# Patient Record
Sex: Female | Born: 1960 | Race: White | Hispanic: No | Marital: Married | Smoking: Never smoker
Health system: Southern US, Community
[De-identification: ages and names within clinical notes are randomized; demographics above are authoritative.]

## PROBLEM LIST (undated history)

## (undated) DIAGNOSIS — Z9109 Other allergy status, other than to drugs and biological substances: Secondary | ICD-10-CM

## (undated) DIAGNOSIS — R5382 Chronic fatigue, unspecified: Secondary | ICD-10-CM

## (undated) DIAGNOSIS — N939 Abnormal uterine and vaginal bleeding, unspecified: Secondary | ICD-10-CM

## (undated) DIAGNOSIS — M25519 Pain in unspecified shoulder: Secondary | ICD-10-CM

## (undated) DIAGNOSIS — T7840XA Allergy, unspecified, initial encounter: Secondary | ICD-10-CM

## (undated) DIAGNOSIS — M1990 Unspecified osteoarthritis, unspecified site: Secondary | ICD-10-CM

## (undated) DIAGNOSIS — F419 Anxiety disorder, unspecified: Secondary | ICD-10-CM

## (undated) DIAGNOSIS — IMO0002 Reserved for concepts with insufficient information to code with codable children: Secondary | ICD-10-CM

## (undated) DIAGNOSIS — N951 Menopausal and female climacteric states: Secondary | ICD-10-CM

## (undated) DIAGNOSIS — G4700 Insomnia, unspecified: Secondary | ICD-10-CM

## (undated) HISTORY — PX: OTHER SURGICAL HISTORY: SHX169

## (undated) HISTORY — PX: ENDOMETRIAL ABLATION: SHX621

## (undated) HISTORY — PX: NASAL SINUS SURGERY: SHX719

## (undated) HISTORY — DX: Abnormal uterine and vaginal bleeding, unspecified: N93.9

## (undated) HISTORY — DX: Unspecified osteoarthritis, unspecified site: M19.90

## (undated) HISTORY — DX: Pain in unspecified shoulder: M25.519

## (undated) HISTORY — PX: DILATION AND CURETTAGE OF UTERUS: SHX78

## (undated) HISTORY — DX: Allergy, unspecified, initial encounter: T78.40XA

## (undated) HISTORY — DX: Anxiety disorder, unspecified: F41.9

## (undated) HISTORY — DX: Reserved for concepts with insufficient information to code with codable children: IMO0002

## (undated) HISTORY — DX: Insomnia, unspecified: G47.00

## (undated) HISTORY — PX: TONSILLECTOMY: SUR1361

## (undated) HISTORY — DX: Chronic fatigue, unspecified: R53.82

## (undated) HISTORY — DX: Menopausal and female climacteric states: N95.1

---

## 2006-07-02 ENCOUNTER — Ambulatory Visit: Payer: Self-pay | Admitting: Otolaryngology

## 2006-07-31 ENCOUNTER — Ambulatory Visit: Payer: Self-pay | Admitting: Otolaryngology

## 2006-11-12 ENCOUNTER — Ambulatory Visit: Payer: Self-pay

## 2006-11-21 ENCOUNTER — Ambulatory Visit: Payer: Self-pay

## 2006-11-28 ENCOUNTER — Ambulatory Visit: Payer: Self-pay

## 2009-02-18 ENCOUNTER — Ambulatory Visit: Payer: Self-pay | Admitting: Podiatry

## 2009-02-21 ENCOUNTER — Ambulatory Visit: Payer: Self-pay

## 2010-01-15 HISTORY — PX: AUGMENTATION MAMMAPLASTY: SUR837

## 2010-03-31 ENCOUNTER — Ambulatory Visit: Payer: Self-pay

## 2010-08-28 ENCOUNTER — Ambulatory Visit: Payer: Self-pay | Admitting: Plastic Surgery

## 2011-05-15 ENCOUNTER — Ambulatory Visit: Payer: Self-pay | Admitting: General Practice

## 2014-04-21 ENCOUNTER — Ambulatory Visit
Admit: 2014-04-21 | Disposition: A | Discharge status: Home / Self Care | Payer: Self-pay | Attending: Obstetrics and Gynecology | Admitting: Obstetrics and Gynecology

## 2014-04-30 ENCOUNTER — Ambulatory Visit
Admit: 2014-04-30 | Disposition: A | Discharge status: Home / Self Care | Payer: Self-pay | Attending: Obstetrics and Gynecology | Admitting: Obstetrics and Gynecology

## 2014-08-26 ENCOUNTER — Other Ambulatory Visit: Payer: Self-pay

## 2014-08-26 MED ORDER — NORETHINDRONE-ETH ESTRADIOL 0.5-2.5 MG-MCG PO TABS: 1.0000 | ORAL_TABLET | Freq: Every day | ORAL | Status: DC

## 2014-08-26 NOTE — Progress Notes (Signed)
Pt requested refill of Femhrt until seen in October. Refill sent as requested.

## 2014-10-21 ENCOUNTER — Ambulatory Visit (INDEPENDENT_AMBULATORY_CARE_PROVIDER_SITE_OTHER): Payer: BLUE CROSS/BLUE SHIELD | Admitting: Obstetrics and Gynecology

## 2014-10-21 ENCOUNTER — Encounter: Payer: Self-pay | Admitting: Obstetrics and Gynecology

## 2014-10-21 VITALS — BP 109/73 | HR 81 | Ht 66.0 in | Wt 143.4 lb

## 2014-10-21 DIAGNOSIS — Z780 Asymptomatic menopausal state: Secondary | ICD-10-CM | POA: Insufficient documentation

## 2014-10-21 MED ORDER — NORETHINDRONE-ETH ESTRADIOL 0.5-2.5 MG-MCG PO TABS: 1.0000 | ORAL_TABLET | Freq: Every day | ORAL | Status: DC

## 2014-10-21 NOTE — Progress Notes (Signed)
Patient ID: Kristin Little, female   DOB: [birth year], 54 y.o.   MRN: [id number] 6 month f/u    Anxiety- d/c zoloft- did not like the way it made her feel  using femhrt - sx controlled  Chief complaint: 1.  Anxiety. 2.  Menopause.  ANXIETY: Patient presents for follow-up.  She has discontinued the medication Zoloft recently because of side effects.  She feels that her symptoms are well controlled off medication and does not desire any other intervention at this time. MENOPAUSE: Generic FemHRT is being used with excellent control of vasomotor symptoms.  No side effects are noted.  Patient wishes to continue with medication.  IMPRESSION: 1.Anxiety, controlled off medication. 2.  Menopausal state, well controlled with HRT.  PLAN: 1.  Continue with FEM HRT Daily; Premarin cream biweekly 2.  Return in January 2017 for annual exam.  A total of 15 minutes were spent face-to-face with the patient during this encounter and over half of that time dealt with counseling and coordination of care.

## 2014-11-10 ENCOUNTER — Encounter: Payer: Self-pay | Admitting: Surgery

## 2014-11-10 ENCOUNTER — Ambulatory Visit (INDEPENDENT_AMBULATORY_CARE_PROVIDER_SITE_OTHER): Payer: BLUE CROSS/BLUE SHIELD | Admitting: Surgery

## 2014-11-10 VITALS — BP 130/85 | HR 83 | Temp 98.3°F | Ht 66.0 in | Wt 143.0 lb

## 2014-11-10 DIAGNOSIS — K645 Perianal venous thrombosis: Secondary | ICD-10-CM

## 2014-11-10 NOTE — Progress Notes (Signed)
Subjective:     Patient ID: Kristin Little, female   DOB: November 28, 1960, 54 y.o.   MRN: [id number]  HPI  54 yr old healthy female comes in with 5 days worth of rectal pain and bleeding.  Patient states pain started last Thursday night.  She states she has been trying to get relief with hemorrhoid wipes and keeping area clean.  Patient states had some blood on wiping and one day had some blood and clots in toilet.  She had hemorrhoids after having her daughter but hasn't had an issue with them for about 15 years.    Past Medical History  Diagnosis Date  . Anxiety   . Chronic fatigue   . Abnormal uterine bleeding (AUB)   . Insomnia   . Perimenopausal   . Squamous cell carcinoma [facility])    Past Surgical History  Procedure Laterality Date  . Augmentation mammaplasty    . Tonsillectomy    . Nasal sinus surgery    . Dilation and curettage of uterus    . Endometrial ablation    . Skin cancer removed     Family History  Problem Relation Age of Onset  . Breast cancer Mother   . Hypertension Father   . Hypothyroidism Father   . Endometriosis Sister   . Diabetes Neg Hx   . Ovarian cancer Neg Hx    Social History   Social History  . Marital Status: Married    Spouse Name: N/A  . Number of Children: N/A  . Years of Education: N/A   Social History Main Topics  . Smoking status: Never Smoker   . Smokeless tobacco: None  . Alcohol Use: Yes     Comment: occas  . Drug Use: No  . Sexual Activity: Yes    Birth Control/ Protection: None   Other Topics Concern  . None   Social History Narrative    Current outpatient prescriptions:  .  conjugated estrogens (PREMARIN) vaginal cream, Place 1 Applicatorful vaginally daily., Disp: , Rfl:  .  norethindrone-ethinyl estradiol (FEMHRT LOW DOSE) 0.5-2.5 MG-MCG tablet, Take 1 tablet by mouth daily., Disp: 30 tablet, Rfl: 6 .  phenylephrine-shark liver oil-mineral oil-petrolatum (PREPARATION H) 0.25-3-14-71.9 % rectal ointment, Place 1 application  rectally 2 (two) times daily as needed for hemorrhoids., Disp: , Rfl:  No Known Allergies     Review of Systems  Constitutional: Negative for fever, chills, activity change and appetite change.  Gastrointestinal: Positive for constipation, blood in stool, anal bleeding and rectal pain. Negative for nausea, vomiting, abdominal pain and diarrhea.  Genitourinary: Negative for dysuria, urgency and hematuria.  Skin: Negative for color change and pallor.  Neurological: Negative for dizziness.  Hematological: Negative for adenopathy. Does not bruise/bleed easily.  All other systems reviewed and are negative.  Filed Vitals:   11/10/14 1419  BP: 130/85  Pulse: 83  Temp: 98.3 F (36.8 C)       Objective:   Physical Exam  Constitutional: She is oriented to person, place, and time. She appears well-developed and well-nourished. No distress.  HENT:  Head: Normocephalic and atraumatic.  Mouth/Throat: No oropharyngeal exudate.  Eyes: Conjunctivae and EOM are normal. Pupils are equal, round, and reactive to light. No scleral icterus.  Neck: Normal range of motion. No tracheal deviation present.  Pulmonary/Chest: Effort normal. No respiratory distress.  Abdominal: Soft. She exhibits no distension.  Genitourinary:  Rectal : Large thrombosed hemorrhoid on left side with the superior half appeared as if it ruptured and  inferior half with clot still inside, very thin walled.   Musculoskeletal: Normal range of motion. She exhibits no edema.  Neurological: She is alert and oriented to person, place, and time.  Skin: Skin is warm and dry.  Psychiatric: She has a normal mood and affect. Her behavior is normal. Judgment and thought content normal.       Assessment:     54 yr old with thrombosed external hemorrhoid     Plan:     Explained to patient that since it was 5 days into the thrombosis, that she had been through the worst of the pain at this point.  It appears that superior portion  already expelled clot and inferior should come soon. Offered to lance now but let her know it might not make a difference at this point.  Recommended warm water soaks at least twice daily and after BMs, keeping stool soft with OTC med or what works for her, continuing to keep clean with wipes and lidocaine gel to ease discomfort.  Also instructed to call on Friday AM if wants it lanced.  Patient is agreeable to this plan.

## 2014-11-10 NOTE — Patient Instructions (Addendum)
Warm water bath soaks twice daily Drink plenty of water Keep bowel movements soft  Lidocaine gel and OTC hemorrhoid wipes/creams as needed

## 2014-12-03 ENCOUNTER — Encounter: Payer: Self-pay | Admitting: Physician Assistant

## 2014-12-03 ENCOUNTER — Ambulatory Visit (INDEPENDENT_AMBULATORY_CARE_PROVIDER_SITE_OTHER): Payer: BLUE CROSS/BLUE SHIELD | Admitting: Physician Assistant

## 2014-12-03 VITALS — BP 118/70 | HR 76 | Temp 98.4°F | Resp 16 | Ht 66.0 in | Wt 145.8 lb

## 2014-12-03 DIAGNOSIS — Z0000 Encounter for general adult medical examination without abnormal findings: Secondary | ICD-10-CM

## 2014-12-03 NOTE — Patient Instructions (Signed)

## 2014-12-03 NOTE — Progress Notes (Signed)
Patient: Kristin Little, Female    DOB: 21-Apr-1960, 54 y.o.   MRN: [id number] Visit Date: 12/03/2014  Today's Provider: Margaretann Loveless, PA-C   Chief Complaint  Patient presents with  . Re-establishing care    Used to see Dr. Reuben Likes   Subjective:    Annual physical exam Kristin Little is a 54 y.o. female who presents today for health maintenance and complete physical. She feels well. She reports not exercising. She reports she is sleeping poorly. Got the Influenza vaccine 10/2014.  She has her pap and breast exams with Dr. Greggory Keen. She also has a history of squamous cell carcinoma skin cancer that was removed.  She is followed regularly by Dr. Adolphus Birchwood for annual skin checks.  She also has an enlarged lymph node in her neck that she is followed by Dr. Willeen Cass.  Pap:2015 Normal Mammogram:2016 -----------------------------------------------------------------   Review of Systems  Constitutional: Positive for activity change. Negative for fever and fatigue.  HENT: Positive for congestion and sinus pressure. Negative for nosebleeds, rhinorrhea, sneezing, tinnitus and trouble swallowing.   Eyes: Positive for itching. Negative for visual disturbance.  Respiratory: Negative.   Cardiovascular: Negative.   Gastrointestinal: Negative.   Endocrine: Negative.   Genitourinary: Negative.   Allergic/Immunologic: Positive for environmental allergies.  Neurological: Positive for headaches. Negative for dizziness, weakness and light-headedness.  Psychiatric/Behavioral: Positive for sleep disturbance. Negative for dysphoric mood. The patient is not nervous/anxious.     Social History She  reports that she has never smoked. She does not have any smokeless tobacco history on file. She reports that she drinks alcohol. She reports that she does not use illicit drugs. Social History   Social History  . Marital Status: Married    Spouse Name: N/A  . Number of Children: N/A    . Years of Education: N/A   Social History Main Topics  . Smoking status: Never Smoker   . Smokeless tobacco: None  . Alcohol Use: Yes     Comment: occas  . Drug Use: No  . Sexual Activity: Yes    Birth Control/ Protection: None   Other Topics Concern  . None   Social History Narrative    Patient Active Problem List   Diagnosis Date Noted  . External hemorrhoid, thrombosed 11/10/2014  . Menopause 10/21/2014    Past Surgical History  Procedure Laterality Date  . Augmentation mammaplasty    . Tonsillectomy    . Nasal sinus surgery    . Dilation and curettage of uterus    . Endometrial ablation    . Skin cancer removed      Family History  Family Status  Relation Status Death Age  . Mother Alive   . Father Alive   . Sister Alive   . Brother Alive    Her family history includes Breast cancer in her mother; Endometriosis in her sister; Hypertension in her father; Hypothyroidism in her father; Seizures in her brother. There is no history of Diabetes or Ovarian cancer.    No Known Allergies  Previous Medications   CONJUGATED ESTROGENS (PREMARIN) VAGINAL CREAM    Place 1 Applicatorful vaginally daily.   NORETHINDRONE-ETHINYL ESTRADIOL (FEMHRT LOW DOSE) 0.5-2.5 MG-MCG TABLET    Take 1 tablet by mouth daily.   PHENYLEPHRINE-SHARK LIVER OIL-MINERAL OIL-PETROLATUM (PREPARATION H) 0.25-3-14-71.9 % RECTAL OINTMENT    Place 1 application rectally 2 (two) times daily as needed for hemorrhoids.    Patient Care Team: Margaretann Loveless,  PA-C as PCP - General (Family Medicine)     Objective:   Vitals: BP 118/70 mmHg  Pulse 76  Temp(Src) 98.4 F (36.9 C) (Oral)  Resp 16  Ht 5\' 6"  (1.676 m)  Wt 145 lb 12.8 oz (66.134 kg)  BMI 23.54 kg/m2   Physical Exam  Constitutional: She is oriented to person, place, and time. She appears well-developed and well-nourished. No distress.  HENT:  Head: Normocephalic and atraumatic.  Right Ear: External ear normal.  Left Ear:  External ear normal.  Nose: Nose normal.  Mouth/Throat: Oropharynx is clear and moist. No oropharyngeal exudate.  Eyes: Conjunctivae and EOM are normal. Pupils are equal, round, and reactive to light. Right eye exhibits no discharge. Left eye exhibits no discharge. No scleral icterus.  Neck: Normal range of motion. Neck supple. No JVD present. No tracheal deviation present. No thyromegaly present.  Cardiovascular: Normal rate, regular rhythm, normal heart sounds and intact distal pulses.  Exam reveals no gallop and no friction rub.   No murmur heard. Pulmonary/Chest: Effort normal and breath sounds normal. No respiratory distress. She has no wheezes. She has no rales. She exhibits no tenderness.  Abdominal: Soft. Bowel sounds are normal. She exhibits no distension and no mass. There is no tenderness. There is no rebound and no guarding.  Musculoskeletal: Normal range of motion. She exhibits no edema or tenderness.  Lymphadenopathy:    She has cervical adenopathy (enlarged lymph node noted on left side of neck followed by Dr. Willeen CassBennett).  Neurological: She is alert and oriented to person, place, and time.  Skin: Skin is warm and dry. No rash noted. She is not diaphoretic.  Psychiatric: She has a normal mood and affect. Her behavior is normal. Judgment and thought content normal.  Vitals reviewed.    Depression Screen No flowsheet data found.    Assessment & Plan:     Routine Health Maintenance and Physical Exam  1. Annual physical exam Annual exam fairly normal.  Will check labs as below.  Will f/u pending labs.  If labs are stable will f/u in one year with repeat physical exam. - TSH - CBC with Differential - Comprehensive Metabolic Panel (CMET) - Lipid panel   Exercise Activities and Dietary recommendations Goals    None       There is no immunization history on file for this patient.  Health Maintenance  Topic Date Due  . Hepatitis C Screening  [birth year]  . HIV  Screening  04/19/1977  . TETANUS/TDAP  04/19/1981  . PAP SMEAR  04/20/1983  . MAMMOGRAM  04/19/2012  . COLONOSCOPY  04/19/2012  . INFLUENZA VACCINE  08/16/2014      Discussed health benefits of physical activity, and encouraged her to engage in regular exercise appropriate for her age and condition.    --------------------------------------------------------------------

## 2014-12-17 LAB — CBC WITH DIFFERENTIAL/PLATELET
BASOS: 1 %
Basophils Absolute: 0 10*3/uL (ref 0.0–0.2)
EOS (ABSOLUTE): 0 10*3/uL (ref 0.0–0.4)
EOS: 1 %
HEMATOCRIT: 40.2 % (ref 34.0–46.6)
Hemoglobin: 13.5 g/dL (ref 11.1–15.9)
Immature Grans (Abs): 0 10*3/uL (ref 0.0–0.1)
Immature Granulocytes: 0 %
LYMPHS ABS: 2 10*3/uL (ref 0.7–3.1)
Lymphs: 45 %
MCH: 32.1 pg (ref 26.6–33.0)
MCHC: 33.6 g/dL (ref 31.5–35.7)
MCV: 96 fL (ref 79–97)
MONOCYTES: 6 %
Monocytes Absolute: 0.3 10*3/uL (ref 0.1–0.9)
NEUTROS ABS: 2 10*3/uL (ref 1.4–7.0)
Neutrophils: 47 %
Platelets: 189 10*3/uL (ref 150–379)
RBC: 4.2 x10E6/uL (ref 3.77–5.28)
RDW: 12.4 % (ref 12.3–15.4)
WBC: 4.4 10*3/uL (ref 3.4–10.8)

## 2014-12-17 LAB — COMPREHENSIVE METABOLIC PANEL
ALBUMIN: 4.5 g/dL (ref 3.5–5.5)
ALK PHOS: 76 IU/L (ref 39–117)
ALT: 12 IU/L (ref 0–32)
AST: 18 IU/L (ref 0–40)
Albumin/Globulin Ratio: 1.7 (ref 1.1–2.5)
BUN / CREAT RATIO: 18 (ref 9–23)
BUN: 18 mg/dL (ref 6–24)
Bilirubin Total: 0.4 mg/dL (ref 0.0–1.2)
CO2: 23 mmol/L (ref 18–29)
Calcium: 9 mg/dL (ref 8.7–10.2)
Chloride: 104 mmol/L (ref 97–106)
Creatinine, Ser: 0.98 mg/dL (ref 0.57–1.00)
GFR calc Af Amer: 77 mL/min/{1.73_m2} (ref >59)
GFR, EST NON AFRICAN AMERICAN: 67 mL/min/{1.73_m2} (ref >59)
GLOBULIN, TOTAL: 2.6 g/dL (ref 1.5–4.5)
Glucose: 93 mg/dL (ref 65–99)
Potassium: 4.2 mmol/L (ref 3.5–5.2)
SODIUM: 142 mmol/L (ref 136–144)
Total Protein: 7.1 g/dL (ref 6.0–8.5)

## 2014-12-17 LAB — TSH: TSH: 0.46 u[IU]/mL (ref 0.450–4.500)

## 2014-12-17 LAB — LIPID PANEL
CHOLESTEROL TOTAL: 207 mg/dL — AB (ref 100–199)
Chol/HDL Ratio: 3.7 ratio units (ref 0.0–4.4)
HDL: 56 mg/dL (ref >39)
LDL Calculated: 138 mg/dL — ABNORMAL HIGH (ref 0–99)
Triglycerides: 63 mg/dL (ref 0–149)
VLDL Cholesterol Cal: 13 mg/dL (ref 5–40)

## 2014-12-20 ENCOUNTER — Telehealth: Payer: Self-pay

## 2014-12-20 NOTE — Telephone Encounter (Signed)
-----   Message from Margaretann LovelessJennifer M Burnette, [place]PA-C sent at 12/20/2014  8:38 AM EST ----- All labs are within normal limits and stable with exception of slight elevation in cholesterol.  Total cholesterol is 207 (like below 200) and LDL (bad) cholesterol is 138 (like below 100). HDL (good) cholesterol is elevated at 56, which is good.  Continue with physical activity making sure to try to exercise for 30-40 minutes 3-4 days per week.  Try to limit saturated fats and foods with high cholesterol in diet.  May take fish oil 1200 mg twice daily or red yeast rice as well to assist with more natural ways of lowering cholesterol.  We will recheck in one year.  Thanks! -JB

## 2014-12-20 NOTE — Telephone Encounter (Signed)
Patient advised as directed below. Patient voiced understanding.  Thanks,  -Joseline 

## 2015-01-27 ENCOUNTER — Ambulatory Visit: Payer: BLUE CROSS/BLUE SHIELD | Admitting: Obstetrics and Gynecology

## 2015-02-28 ENCOUNTER — Ambulatory Visit (INDEPENDENT_AMBULATORY_CARE_PROVIDER_SITE_OTHER): Payer: BLUE CROSS/BLUE SHIELD | Admitting: Obstetrics and Gynecology

## 2015-02-28 VITALS — BP 150/74 | HR 80 | Ht 66.0 in | Wt 143.2 lb

## 2015-02-28 DIAGNOSIS — N3091 Cystitis, unspecified with hematuria: Secondary | ICD-10-CM | POA: Insufficient documentation

## 2015-02-28 DIAGNOSIS — R319 Hematuria, unspecified: Secondary | ICD-10-CM

## 2015-02-28 DIAGNOSIS — R300 Dysuria: Secondary | ICD-10-CM

## 2015-02-28 DIAGNOSIS — N390 Urinary tract infection, site not specified: Secondary | ICD-10-CM | POA: Diagnosis not present

## 2015-02-28 DIAGNOSIS — R351 Nocturia: Secondary | ICD-10-CM

## 2015-02-28 LAB — POCT URINALYSIS DIPSTICK
BILIRUBIN UA: NEGATIVE
GLUCOSE UA: NEGATIVE
KETONES UA: NEGATIVE
NITRITE UA: POSITIVE
Protein, UA: 2
Spec Grav, UA: 1.025
Urobilinogen, UA: 0.2
pH, UA: 6

## 2015-02-28 MED ORDER — NITROFURANTOIN MONOHYD MACRO 100 MG PO CAPS: 100.0000 mg | ORAL_CAPSULE | Freq: Two times a day (BID) | ORAL | Status: DC

## 2015-02-28 NOTE — Patient Instructions (Signed)
Asymptomatic Bacteriuria, Female Asymptomatic bacteriuria is the presence of a large number of bacteria in your urine without the usual symptoms of burning or frequent urination. The following conditions increase the risk of asymptomatic bacteriuria:  Diabetes mellitus.  Advanced age.  Pregnancy in the first trimester.  Kidney stones.  Kidney transplants.  Leaky kidney tube valve in young children (reflux). Treatment for this condition is not needed in most people and can lead to other problems such as too much yeast and growth of resistant bacteria. However, some people, such as pregnant women, do need treatment to prevent kidney infection. Asymptomatic bacteriuria in pregnancy is also associated with fetal growth restriction, premature labor, and newborn death. HOME CARE INSTRUCTIONS Monitor your condition for any changes. The following actions may help to relieve any discomfort you are feeling:  Drink enough water and fluids to keep your urine clear or pale yellow. Go to the bathroom more often to keep your bladder empty.  Keep the area around your vagina and rectum clean. Wipe yourself from front to back after urinating. SEEK IMMEDIATE MEDICAL CARE IF:  You develop signs of an infection such as:  Burning with urination.  Frequency of voiding.  Back pain.  Fever.  You have blood in the urine.  You develop a fever. MAKE SURE YOU:  Understand these instructions.  Will watch your condition.  Will get help right away if you are not doing well or get worse.   This information is not intended to replace advice given to you by your health care provider. Make sure you discuss any questions you have with your health care provider.   Document Released: 01/01/2005 Document Revised: 01/22/2014 Document Reviewed: 06/23/2012 Elsevier Interactive Patient Education 2016 Elsevier Inc.  

## 2015-02-28 NOTE — Progress Notes (Signed)
Patient ID: Kristin Little, female   DOB: 26-Sep-1960, 55 y.o.   MRN: [id number]   Pt presents for frequency, burning with urination, pelvic pressure, and nocturia x 2 days. NO fevers noted. NO flank pain. U/a is positive for blood, nitrites, and leuks. Culture sent. Erx Macrobid bid x 7d. Samples of Urogesic blue given. Pt advised to push h20 and cranberry juice. Will contact patient with culture results.   ASSESSMENT: 1. UTI symptoms  PLAN: 1. Macrobid twice a day for 7 days 2. Urology is samples given 3. Increase water intake. 4. Will notify when results are available.  Herold Harms, MD

## 2015-03-03 LAB — URINE CULTURE

## 2015-04-06 ENCOUNTER — Ambulatory Visit (INDEPENDENT_AMBULATORY_CARE_PROVIDER_SITE_OTHER): Payer: BLUE CROSS/BLUE SHIELD | Admitting: Obstetrics and Gynecology

## 2015-04-06 ENCOUNTER — Encounter: Payer: Self-pay | Admitting: Obstetrics and Gynecology

## 2015-04-06 VITALS — BP 130/85 | HR 83 | Temp 98.5°F | Wt 142.1 lb

## 2015-04-06 DIAGNOSIS — N390 Urinary tract infection, site not specified: Secondary | ICD-10-CM

## 2015-04-06 DIAGNOSIS — R319 Hematuria, unspecified: Secondary | ICD-10-CM

## 2015-04-06 LAB — POCT URINALYSIS DIPSTICK
Bilirubin, UA: NEGATIVE
GLUCOSE UA: NEGATIVE
Ketones, UA: NEGATIVE
NITRITE UA: NEGATIVE
Spec Grav, UA: 1.02
UROBILINOGEN UA: NEGATIVE
pH, UA: 6

## 2015-04-06 MED ORDER — NITROFURANTOIN MONOHYD MACRO 100 MG PO CAPS: 100.0000 mg | ORAL_CAPSULE | Freq: Two times a day (BID) | ORAL | Status: DC

## 2015-04-06 NOTE — Patient Instructions (Signed)
1. Increase water intake and cranberry juice intake 2. Macrobid 1 tablet twice a day for 7 days 3. Encourage emptying bladder pre and Post intercourse 4. Return as scheduled for annual exam

## 2015-04-06 NOTE — Addendum Note (Signed)
Addended by: Jackquline DenmarkIDGEWAY, Jemia Fata W on: 04/06/2015 09:29 AM   Modules accepted: Orders

## 2015-04-06 NOTE — Progress Notes (Signed)
Chief complaint: 1. UTI symptoms  Patient presents with several day history of dysuria frequency urgency and voiding blood. No fever; no flank pain; no chills or sweats. Last 2 episodes of UTIs haven't been associated with intercourse.  Past family history, past surgical history, problem list, medications, and allergies are reviewed.  OBJECTIVE: There were no vitals taken for this visit. Well-appearing white female in no acute distress Back: No CVA tenderness Abdomen: Soft, nontender, without organomegaly; no suprapubic tenderness is appreciated Pelvic: Deferred  Urinalysis notable for blood and leukocyte esterase  ASSESSMENT: 1. UTI symptoms and urinalysis consistent with hemorrhagic cystitis  PLAN: 1. Increase by mouth fluid intake and cranberry juice intake 2. Macrobid twice daily for 7 days 3. Return as scheduled for annual exam next month 4. Encourage pre-and post intercourse voiding.   Herold HarmsMartin A Defrancesco, MD  Note: This dictation was prepared with Dragon dictation along with smaller phrase technology. Any transcriptional errors that result from this process are unintentional.

## 2015-04-11 LAB — URINE CULTURE

## 2015-05-04 ENCOUNTER — Ambulatory Visit (INDEPENDENT_AMBULATORY_CARE_PROVIDER_SITE_OTHER): Payer: BLUE CROSS/BLUE SHIELD | Admitting: Obstetrics and Gynecology

## 2015-05-04 ENCOUNTER — Encounter: Payer: Self-pay | Admitting: Obstetrics and Gynecology

## 2015-05-04 VITALS — BP 144/82 | HR 71 | Wt 139.5 lb

## 2015-05-04 DIAGNOSIS — Z0000 Encounter for general adult medical examination without abnormal findings: Secondary | ICD-10-CM

## 2015-05-04 DIAGNOSIS — K648 Other hemorrhoids: Secondary | ICD-10-CM

## 2015-05-04 DIAGNOSIS — Z9889 Other specified postprocedural states: Secondary | ICD-10-CM | POA: Diagnosis not present

## 2015-05-04 DIAGNOSIS — Z1211 Encounter for screening for malignant neoplasm of colon: Secondary | ICD-10-CM | POA: Diagnosis not present

## 2015-05-04 DIAGNOSIS — K644 Residual hemorrhoidal skin tags: Secondary | ICD-10-CM | POA: Insufficient documentation

## 2015-05-04 DIAGNOSIS — Z780 Asymptomatic menopausal state: Secondary | ICD-10-CM | POA: Diagnosis not present

## 2015-05-04 DIAGNOSIS — Z1239 Encounter for other screening for malignant neoplasm of breast: Secondary | ICD-10-CM | POA: Diagnosis not present

## 2015-05-04 DIAGNOSIS — Z01419 Encounter for gynecological examination (general) (routine) without abnormal findings: Secondary | ICD-10-CM

## 2015-05-04 MED ORDER — NORETHINDRONE-ETH ESTRADIOL 0.5-2.5 MG-MCG PO TABS: 1.0000 | ORAL_TABLET | Freq: Every day | ORAL | Status: DC

## 2015-05-04 NOTE — Patient Instructions (Signed)
1. No Pap smear 2. Mammogram ordered 3. Stool guaiac cards given 4. Calcium with vitamin D supplementation encouraged 4. FEMHRT refilled 6. Return in 1 year

## 2015-05-04 NOTE — Progress Notes (Signed)
GYN ANNUAL PREVENTATIVE CARE ENCOUNTER NOTE  Subjective:       Kristin Little is a 55 y.o. No obstetric history on file. female here for a routine annual gynecologic exam.  Current complaints:  1. Recurrent UTI.     Gynecologic History No LMP recorded. Patient is postmenopausal. Contraception: post menopausal status Last Pap: 2015 negative/negative Last mammogram: BI-RADS 1  Menarche-age History of irregular cycles without dysmenorrhea Family history of endometriosis Sr. No history of abnormal Pap smear No History of abnormal mammograms Endometrial ablation 2008  Obstetric History OB History  No data available    Past Medical History  Diagnosis Date  . Anxiety   . Chronic fatigue   . Abnormal uterine bleeding (AUB)   . Insomnia   . Perimenopausal   . Squamous cell carcinoma [facility])     Past Surgical History  Procedure Laterality Date  . Augmentation mammaplasty    . Tonsillectomy    . Nasal sinus surgery    . Dilation and curettage of uterus    . Endometrial ablation    . Skin cancer removed      Current Outpatient Prescriptions on File Prior to Visit  Medication Sig Dispense Refill  . conjugated estrogens (PREMARIN) vaginal cream Place 1 Applicatorful vaginally daily.    . nitrofurantoin, macrocrystal-monohydrate, (MACROBID) 100 MG capsule Take 1 capsule (100 mg total) by mouth 2 (two) times daily. 14 capsule 1  . norethindrone-ethinyl estradiol (FEMHRT LOW DOSE) 0.5-2.5 MG-MCG tablet Take 1 tablet by mouth daily. 30 tablet 6   No current facility-administered medications on file prior to visit.    No Known Allergies  Social History   Social History  . Marital Status: Married    Spouse Name: N/A  . Number of Children: N/A  . Years of Education: N/A   Occupational History  . Not on file.   Social History Main Topics  . Smoking status: Never Smoker   . Smokeless tobacco: Not on file  . Alcohol Use: Yes     Comment: occas  . Drug Use: No  . Sexual  Activity: Yes    Birth Control/ Protection: None   Other Topics Concern  . Not on file   Social History Narrative    Family History  Problem Relation Age of Onset  . Breast cancer Mother   . Hypertension Father   . Hypothyroidism Father   . Endometriosis Sister   . Diabetes Neg Hx   . Ovarian cancer Neg Hx   . Seizures Brother     The following portions of the patient's history were reviewed and updated as appropriate: allergies, current medications, past family history, past medical history, past social history, past surgical history and problem list.  Review of Systems Review of Systems  Constitutional: Negative.   HENT: Negative.   Respiratory: Negative.   Cardiovascular: Negative.   Genitourinary: Negative.   Musculoskeletal: Negative.   Skin: Negative.   Neurological: Negative.   Endo/Heme/Allergies: Negative.   Psychiatric/Behavioral: Negative.      Objective:   There were no vitals taken for this visit. Physical Exam  Constitutional: She is oriented to person, place, and time. She appears well-developed and well-nourished.  HENT:  Head: Normocephalic and atraumatic.  Eyes: Conjunctivae are normal.  Neck: Normal range of motion. Neck supple. No thyromegaly present.  Cardiovascular: Normal rate and regular rhythm.   No murmur heard. Pulmonary/Chest: Effort normal and breath sounds normal.  Abdominal: Soft. She exhibits no distension and no mass. There is  no tenderness. No hernia.  Genitourinary:  External genitalia normal BUS normal Gynecologic normal Cervix-normal Uterus-midplane, normal size and shape, mobile, nontender Adnexa-nonpalpable and nontender Rectovaginal-external nonthrombosed hemorrhoid; normal sphincter tone; no rectal masses  Musculoskeletal: Normal range of motion.  Lymphadenopathy:    She has no cervical adenopathy.  Neurological: She is alert and oriented to person, place, and time.  Skin: Skin is warm and dry.  Psychiatric: She  has a normal mood and affect. Her behavior is normal. Thought content normal.  Vitals reviewed.   Assessment:   Annual gynecologic examination 55 y.o. Contraception: post menopausal status Normal BMI Menopause, asymptomatic on HRT History of recurrent UTI, no recent infection  Plan:  Pap: Next in 2018 and Not needed Mammogram: Ordered Labs: By PCP Routine preventative health maintenance measures emphasized: Diet/Weight control, Tobacco Cessation and Alcohol/Drug use Refill FemHRT Encourage calcium with vitamin D 1200 mg a day Return to Clinic - 1 Year   Herold Harms, MD   Note: This dictation was prepared with Dragon dictation along with smaller phrase technology. Any transcriptional errors that result from this process are unintentional.

## 2015-06-17 ENCOUNTER — Other Ambulatory Visit: Payer: Self-pay

## 2015-06-22 ENCOUNTER — Encounter: Payer: Self-pay | Admitting: *Deleted

## 2015-06-22 DIAGNOSIS — Z85828 Personal history of other malignant neoplasm of skin: Secondary | ICD-10-CM | POA: Diagnosis not present

## 2015-06-30 NOTE — Discharge Instructions (Signed)

## 2015-07-04 ENCOUNTER — Encounter
Admission: RE | Disposition: A | Discharge status: Home / Self Care | Payer: Self-pay | Source: Ambulatory Visit | Attending: Gastroenterology

## 2015-07-04 ENCOUNTER — Ambulatory Visit
Admission: RE | Admit: 2015-07-04 | Discharge: 2015-07-04 | Disposition: A | Discharge status: Home / Self Care | Payer: BLUE CROSS/BLUE SHIELD | Source: Ambulatory Visit | Attending: Gastroenterology | Admitting: Gastroenterology

## 2015-07-04 ENCOUNTER — Ambulatory Visit: Payer: BLUE CROSS/BLUE SHIELD | Admitting: Anesthesiology

## 2015-07-04 DIAGNOSIS — Z803 Family history of malignant neoplasm of breast: Secondary | ICD-10-CM | POA: Insufficient documentation

## 2015-07-04 DIAGNOSIS — Z85828 Personal history of other malignant neoplasm of skin: Secondary | ICD-10-CM | POA: Diagnosis not present

## 2015-07-04 DIAGNOSIS — Z8249 Family history of ischemic heart disease and other diseases of the circulatory system: Secondary | ICD-10-CM | POA: Diagnosis not present

## 2015-07-04 DIAGNOSIS — Z1211 Encounter for screening for malignant neoplasm of colon: Secondary | ICD-10-CM | POA: Diagnosis not present

## 2015-07-04 DIAGNOSIS — Z79899 Other long term (current) drug therapy: Secondary | ICD-10-CM | POA: Insufficient documentation

## 2015-07-04 DIAGNOSIS — G4700 Insomnia, unspecified: Secondary | ICD-10-CM | POA: Insufficient documentation

## 2015-07-04 DIAGNOSIS — Z820 Family history of epilepsy and other diseases of the nervous system: Secondary | ICD-10-CM | POA: Diagnosis not present

## 2015-07-04 DIAGNOSIS — Z842 Family history of other diseases of the genitourinary system: Secondary | ICD-10-CM | POA: Diagnosis not present

## 2015-07-04 DIAGNOSIS — Z8349 Family history of other endocrine, nutritional and metabolic diseases: Secondary | ICD-10-CM | POA: Diagnosis not present

## 2015-07-04 DIAGNOSIS — K641 Second degree hemorrhoids: Secondary | ICD-10-CM | POA: Diagnosis not present

## 2015-07-04 DIAGNOSIS — F419 Anxiety disorder, unspecified: Secondary | ICD-10-CM | POA: Insufficient documentation

## 2015-07-04 HISTORY — PX: COLONOSCOPY WITH PROPOFOL: SHX5780

## 2015-07-04 HISTORY — DX: Other allergy status, other than to drugs and biological substances: Z91.09

## 2015-07-04 SURGERY — COLONOSCOPY WITH PROPOFOL
Anesthesia: Monitor Anesthesia Care | Wound class: Contaminated

## 2015-07-04 MED ORDER — LIDOCAINE HCL (CARDIAC) 20 MG/ML IV SOLN
INTRAVENOUS | Status: DC | PRN
  Administered 2015-07-04: 40 mg via INTRAVENOUS

## 2015-07-04 MED ORDER — PROPOFOL 10 MG/ML IV BOLUS
INTRAVENOUS | Status: DC | PRN
  Administered 2015-07-04 (×2): 40 mg via INTRAVENOUS
  Administered 2015-07-04 (×2): 20 mg via INTRAVENOUS
  Administered 2015-07-04 (×2): 40 mg via INTRAVENOUS

## 2015-07-04 MED ORDER — STERILE WATER FOR IRRIGATION IR SOLN
Status: DC | PRN
  Administered 2015-07-04: 10:00:00

## 2015-07-04 MED ORDER — LACTATED RINGERS IV SOLN
INTRAVENOUS | Status: DC
  Administered 2015-07-04: 09:00:00 via INTRAVENOUS

## 2015-07-04 SURGICAL SUPPLY — 22 items
CANISTER SUCT 1200ML W/VALVE (MISCELLANEOUS) ×3 IMPLANT
CLIP HMST 235XBRD CATH ROT (MISCELLANEOUS) IMPLANT
CLIP RESOLUTION 360 11X235 (MISCELLANEOUS)
FCP ESCP3.2XJMB 240X2.8X (MISCELLANEOUS)
FORCEPS BIOP RAD 4 LRG CAP 4 (CUTTING FORCEPS) IMPLANT
FORCEPS BIOP RJ4 240 W/NDL (MISCELLANEOUS)
FORCEPS ESCP3.2XJMB 240X2.8X (MISCELLANEOUS) IMPLANT
GOWN CVR UNV OPN BCK APRN NK (MISCELLANEOUS) ×2 IMPLANT
GOWN ISOL THUMB LOOP REG UNIV (MISCELLANEOUS) ×4
INJECTOR VARIJECT VIN23 (MISCELLANEOUS) IMPLANT
KIT DEFENDO VALVE AND CONN (KITS) IMPLANT
KIT ENDO PROCEDURE OLY (KITS) ×3 IMPLANT
MARKER SPOT ENDO TATTOO 5ML (MISCELLANEOUS) IMPLANT
PAD GROUND ADULT SPLIT (MISCELLANEOUS) IMPLANT
PROBE APC STR FIRE (PROBE) IMPLANT
SNARE SHORT THROW 13M SML OVAL (MISCELLANEOUS) IMPLANT
SNARE SHORT THROW 30M LRG OVAL (MISCELLANEOUS) IMPLANT
SNARE SNG USE RND 15MM (INSTRUMENTS) IMPLANT
SPOT EX ENDOSCOPIC TATTOO (MISCELLANEOUS)
TRAP ETRAP POLY (MISCELLANEOUS) IMPLANT
VARIJECT INJECTOR VIN23 (MISCELLANEOUS)
WATER STERILE IRR 250ML POUR (IV SOLUTION) ×3 IMPLANT

## 2015-07-04 NOTE — Anesthesia Postprocedure Evaluation (Signed)
Anesthesia Post Note  Patient: Girard CooterConnie M Valenza  Procedure(s) Performed: Procedure(s) (LRB): COLONOSCOPY WITH PROPOFOL (N/A)  Patient location during evaluation: PACU Anesthesia Type: MAC Level of consciousness: awake and alert Pain management: pain level controlled Vital Signs Assessment: post-procedure vital signs reviewed and stable Respiratory status: spontaneous breathing, nonlabored ventilation, respiratory function stable and patient connected to nasal cannula oxygen Cardiovascular status: stable and blood pressure returned to baseline Anesthetic complications: no    Jayveon Convey

## 2015-07-04 NOTE — Op Note (Addendum)
[facility] Patient Name: Kristin Little Procedure Date: 07/04/2015 9:44 AM MRN: [id number] Account #: [id number] Date of Birth: [birth year] Admit Type: Outpatient Age: 55 Room: [facility] OR ROOM 01 Gender: Female Note Status: Finalized Procedure:            Colonoscopy Indications:          Screening for colorectal malignant neoplasm Providers:            Midge Minium, MD Referring MD:         Leo Grosser, MD (Referring MD) Medicines:            Propofol per Anesthesia Complications:        No immediate complications. Procedure:            Pre-Anesthesia Assessment:                       - Prior to the procedure, a History and Physical was                        performed, and patient medications and allergies were                        reviewed. The patient's tolerance of previous                        anesthesia was also reviewed. The risks and benefits of                        the procedure and the sedation options and risks were                        discussed with the patient. All questions were                        answered, and informed consent was obtained. Prior                        Anticoagulants: The patient has taken no previous                        anticoagulant or antiplatelet agents. ASA Grade                        Assessment: II - A patient with mild systemic disease.                        After reviewing the risks and benefits, the patient was                        deemed in satisfactory condition to undergo the                        procedure.                       After obtaining informed consent, the colonoscope was                        passed under direct vision. Throughout the procedure,  the patient's blood pressure, pulse, and oxygen                        saturations were monitored continuously. The Olympus CF                        H180AL colonoscope (S#: [id number]) was introduced through                         the anus and advanced to the the cecum, identified by                        appendiceal orifice and ileocecal valve. The                        colonoscopy was performed without difficulty. The                        patient tolerated the procedure well. The quality of                        the bowel preparation was excellent. Findings:      The perianal and digital rectal examinations were normal.      Non-bleeding internal hemorrhoids were found during retroflexion. The       hemorrhoids were Grade II (internal hemorrhoids that prolapse but reduce       spontaneously). Impression:           - Non-bleeding internal hemorrhoids.                       - No specimens collected. Recommendation:       - Repeat colonoscopy in 10 years for screening unless                        any change in family history or lower GI problems. Procedure Code(s):    --- Professional ---                       [phone], Colonoscopy, flexible; diagnostic, including                        collection of specimen(s) by brushing or washing, when                        performed (separate procedure) Diagnosis Code(s):    --- Professional ---                       Z12.11, Encounter for screening for malignant neoplasm                        of colon                       K64.1, Second degree hemorrhoids CPT copyright 2016 American Medical Association. All rights reserved. The codes documented in this report are preliminary and upon coder review may  be revised to meet current compliance requirements. Midge Minium, MD 07/04/2015 9:59:23 AM This report has been signed electronically. Number of Addenda: 0 Note Initiated On: 07/04/2015 9:44 AM Scope Withdrawal Time: 0 hours 7 minutes 3 seconds  Total  Procedure Duration: 0 hours 13 minutes 38 seconds       [facility]

## 2015-07-04 NOTE — Anesthesia Preprocedure Evaluation (Signed)
Anesthesia Evaluation  Patient identified by MRN, date of birth, ID band  Reviewed: NPO status   History of Anesthesia Complications Negative for: history of anesthetic complications  Airway Mallampati: II  TM Distance: >3 FB Neck ROM: full    Dental no notable dental hx.    Pulmonary neg pulmonary ROS,    Pulmonary exam normal        Cardiovascular Exercise Tolerance: Good negative cardio ROS Normal cardiovascular exam     Neuro/Psych negative neurological ROS  negative psych ROS   GI/Hepatic negative GI ROS, Neg liver ROS,   Endo/Other  negative endocrine ROS  Renal/GU negative Renal ROS  negative genitourinary   Musculoskeletal   Abdominal   Peds  Hematology Skin cancer   Anesthesia Other Findings   Reproductive/Obstetrics                             Anesthesia Physical Anesthesia Plan  ASA: II  Anesthesia Plan: MAC   Post-op Pain Management:    Induction:   Airway Management Planned:   Additional Equipment:   Intra-op Plan:   Post-operative Plan:   Informed Consent: I have reviewed the patients History and Physical, chart, labs and discussed the procedure including the risks, benefits and alternatives for the proposed anesthesia with the patient or authorized representative who has indicated his/her understanding and acceptance.     Plan Discussed with: CRNA  Anesthesia Plan Comments:         Anesthesia Quick Evaluation

## 2015-07-04 NOTE — H&P (Signed)
  Midge Miniumarren Kiano Terrien, MD [facility] [address] [place], [place] [address] Phone: [phone] Fax : [phone]  Primary Care Physician:  Margaretann LovelessJennifer M Burnette, PA-C Primary Gastroenterologist:  Dr. Servando SnareWohl  Pre-Procedure History & Physical: HPI:  Kristin CooterConnie M Little is a 55 y.o. female is here for a screening colonoscopy.   Past Medical History  Diagnosis Date  . Anxiety   . Chronic fatigue   . Abnormal uterine bleeding (AUB)   . Insomnia   . Perimenopausal   . Squamous cell carcinoma (HCC)   . Environmental allergies     Past Surgical History  Procedure Laterality Date  . Augmentation mammaplasty    . Tonsillectomy    . Nasal sinus surgery    . Dilation and curettage of uterus    . Endometrial ablation    . Skin cancer removed      Prior to Admission medications   Medication Sig Start Date End Date Taking? Authorizing Provider  Calcium-Vitamin D-Vitamin K (VIACTIV PO) Take by mouth daily.   Yes Historical Provider, MD  conjugated estrogens (PREMARIN) vaginal cream Place 1 Applicatorful vaginally daily.   Yes Historical Provider, MD  norethindrone-ethinyl estradiol (FEMHRT LOW DOSE) 0.5-2.5 MG-MCG tablet Take 1 tablet by mouth daily. 05/04/15  Yes Herold HarmsMartin A Defrancesco, MD    Allergies as of 06/17/2015  . (No Known Allergies)    Family History  Problem Relation Age of Onset  . Breast cancer Mother   . Hypertension Father   . Hypothyroidism Father   . Endometriosis Sister   . Diabetes Neg Hx   . Ovarian cancer Neg Hx   . Seizures Brother     Social History   Social History  . Marital Status: Married    Spouse Name: N/A  . Number of Children: N/A  . Years of Education: N/A   Occupational History  . Not on file.   Social History Main Topics  . Smoking status: Never Smoker   . Smokeless tobacco: Not on file  . Alcohol Use: Yes     Comment: occas - 4 drinks/mo  . Drug Use: No  . Sexual Activity: Yes    Birth Control/ Protection: None   Other Topics Concern    . Not on file   Social History Narrative    Review of Systems: See HPI, otherwise negative ROS  Physical Exam: Ht 5\' 6"  (1.676 m)  Wt 140 lb (63.504 kg)  BMI 22.61 kg/m2 General:   Alert,  pleasant and cooperative in NAD Head:  Normocephalic and atraumatic. Neck:  Supple; no masses or thyromegaly. Lungs:  Clear throughout to auscultation.    Heart:  Regular rate and rhythm. Abdomen:  Soft, nontender and nondistended. Normal bowel sounds, without guarding, and without rebound.   Neurologic:  Alert and  oriented x4;  grossly normal neurologically.  Impression/Plan: Kristin Little is now here to undergo a screening colonoscopy.  Risks, benefits, and alternatives regarding colonoscopy have been reviewed with the patient.  Questions have been answered.  All parties agreeable.

## 2015-07-04 NOTE — Transfer of Care (Signed)
Immediate Anesthesia Transfer of Care Note  Patient: Kristin CooterConnie M Dornfeld  Procedure(s) Performed: Procedure(s): COLONOSCOPY WITH PROPOFOL (N/A)  Patient Location: PACU  Anesthesia Type: MAC  Level of Consciousness: awake, alert  and patient cooperative  Airway and Oxygen Therapy: Patient Spontanous Breathing and Patient connected to supplemental oxygen  Post-op Assessment: Post-op Vital signs reviewed, Patient's Cardiovascular Status Stable, Respiratory Function Stable, Patent Airway and No signs of Nausea or vomiting  Post-op Vital Signs: Reviewed and stable  Complications: No apparent anesthesia complications

## 2015-07-05 ENCOUNTER — Encounter: Payer: Self-pay | Admitting: Gastroenterology

## 2015-10-25 ENCOUNTER — Other Ambulatory Visit: Payer: Self-pay

## 2015-10-25 MED ORDER — NORETHINDRONE-ETH ESTRADIOL 0.5-2.5 MG-MCG PO TABS: 1.0000 | ORAL_TABLET | Freq: Every day | ORAL | 6 refills | Status: DC

## 2015-12-05 ENCOUNTER — Encounter: Payer: BLUE CROSS/BLUE SHIELD | Admitting: Physician Assistant

## 2015-12-09 ENCOUNTER — Ambulatory Visit (INDEPENDENT_AMBULATORY_CARE_PROVIDER_SITE_OTHER): Payer: BLUE CROSS/BLUE SHIELD | Admitting: Physician Assistant

## 2015-12-09 ENCOUNTER — Other Ambulatory Visit: Payer: Self-pay

## 2015-12-09 ENCOUNTER — Encounter: Payer: Self-pay | Admitting: Physician Assistant

## 2015-12-09 VITALS — BP 120/78 | HR 69 | Temp 98.3°F | Resp 16 | Wt 140.0 lb

## 2015-12-09 DIAGNOSIS — J0100 Acute maxillary sinusitis, unspecified: Secondary | ICD-10-CM

## 2015-12-09 MED ORDER — AMOXICILLIN-POT CLAVULANATE 875-125 MG PO TABS: 1.0000 | ORAL_TABLET | Freq: Two times a day (BID) | ORAL | 0 refills | Status: DC

## 2015-12-09 NOTE — Progress Notes (Unsigned)
       Patient: Kristin Little Female    DOB: 02/26/1960   55 y.o.   MRN: [id number] Visit Date: 12/09/2015  Today's Provider: Marjie SkiffJoseline E Rosas, CMA   No chief complaint on file.  Subjective:    Sinusitis        No Known Allergies   Current Outpatient Prescriptions:  .  Calcium-Vitamin D-Vitamin K (VIACTIV PO), Take by mouth daily., Disp: , Rfl:  .  conjugated estrogens (PREMARIN) vaginal cream, Place 1 Applicatorful vaginally daily., Disp: , Rfl:  .  FLUARIX QUADRIVALENT 0.5 ML injection, , Disp: , Rfl:  .  norethindrone-ethinyl estradiol (FEMHRT LOW DOSE) 0.5-2.5 MG-MCG tablet, Take 1 tablet by mouth daily., Disp: 30 tablet, Rfl: 6  Review of Systems  Social History  Substance Use Topics  . Smoking status: Never Smoker  . Smokeless tobacco: Not on file  . Alcohol use Yes     Comment: occas - 4 drinks/mo   Objective:   There were no vitals taken for this visit.  Physical Exam      Assessment & Plan:           Marjie SkiffJoseline E Rosas, CMA  [facility]

## 2015-12-09 NOTE — Progress Notes (Signed)
Patient: Kristin Little Female    DOB: 06/27/1960   55 y.o.   MRN: [id number] Visit Date: 12/09/2015  Today's Provider: Margaretann LovelessJennifer M Burnette, PA-C   Chief Complaint  Patient presents with  . Sinusitis   Subjective:    Sinusitis  This is a new problem. The current episode started in the past 7 days (Wednesday). The problem has been gradually worsening since onset. There has been no fever. Associated symptoms include chills, congestion, coughing, ear pain, headaches, sinus pressure and a sore throat. Pertinent negatives include no shortness of breath. Treatments tried: Migraine med [employer] brand and Sudafed; has also used saline lavage as well. The treatment provided no relief.      No Known Allergies   Current Outpatient Prescriptions:  .  Calcium-Vitamin D-Vitamin K (VIACTIV PO), Take by mouth daily., Disp: , Rfl:  .  conjugated estrogens (PREMARIN) vaginal cream, Place 1 Applicatorful vaginally daily., Disp: , Rfl:  .  norethindrone-ethinyl estradiol (FEMHRT LOW DOSE) 0.5-2.5 MG-MCG tablet, Take 1 tablet by mouth daily., Disp: 30 tablet, Rfl: 6 .  FLUARIX QUADRIVALENT 0.5 ML injection, , Disp: , Rfl:   Review of Systems  Constitutional: Positive for chills.  HENT: Positive for congestion, ear pain, postnasal drip, rhinorrhea (yellowish color), sinus pain, sinus pressure and sore throat. Negative for trouble swallowing.   Eyes: Negative for visual disturbance.       Eyes hurt  Respiratory: Positive for cough. Negative for chest tightness, shortness of breath and wheezing.   Cardiovascular: Negative for chest pain, palpitations and leg swelling.  Gastrointestinal: Negative for abdominal pain and nausea.  Musculoskeletal:       Body aches  Neurological: Positive for headaches. Negative for dizziness.  Psychiatric/Behavioral: Positive for sleep disturbance.    Social History  Substance Use Topics  . Smoking status: Never Smoker  . Smokeless tobacco: Never Used  .  Alcohol use Yes     Comment: occas - 4 drinks/mo   Objective:   BP 120/78 (BP Location: Right Arm, Patient Position: Sitting, Cuff Size: Normal)   Pulse 69   Temp 98.3 F (36.8 C) (Oral)   Resp 16   Wt 140 lb (63.5 kg)   SpO2 97%   BMI 22.60 kg/m   Physical Exam  Constitutional: She appears well-developed and well-nourished. No distress.  HENT:  Head: Normocephalic and atraumatic.  Right Ear: Hearing, external ear and ear canal normal. Tympanic membrane is not erythematous and not bulging. A middle ear effusion is present.  Left Ear: Hearing, tympanic membrane, external ear and ear canal normal. Tympanic membrane is not erythematous and not bulging.  No middle ear effusion.  Nose: Mucosal edema and rhinorrhea present. Right sinus exhibits maxillary sinus tenderness. Right sinus exhibits no frontal sinus tenderness. Left sinus exhibits maxillary sinus tenderness. Left sinus exhibits no frontal sinus tenderness.  Mouth/Throat: Uvula is midline, oropharynx is clear and moist and mucous membranes are normal. No oropharyngeal exudate, posterior oropharyngeal edema or posterior oropharyngeal erythema.  Neck: Normal range of motion. Neck supple. No tracheal deviation present. No thyromegaly present.  Cardiovascular: Normal rate, regular rhythm and normal heart sounds.  Exam reveals no gallop and no friction rub.   No murmur heard. Pulmonary/Chest: Effort normal and breath sounds normal. No stridor. No respiratory distress. She has no wheezes. She has no rales.  Lymphadenopathy:    She has cervical adenopathy.       Right cervical: Superficial cervical adenopathy present.  Skin: She  is not diaphoretic.  Vitals reviewed.      Assessment & Plan:     1. Acute maxillary sinusitis, recurrence not specified Worsening symptoms that have not responded to OTC medications. Will give augmentin as below. Continue allergy medications and saline nasal washes. Stay well hydrated and get plenty of rest.  Call if no symptom improvement or if symptoms worsen. - amoxicillin-clavulanate (AUGMENTIN) 875-125 MG tablet; Take 1 tablet by mouth 2 (two) times daily.  Dispense: 14 tablet; Refill: 0       Margaretann LovelessJennifer M Burnette, PA-C  [facility]

## 2015-12-09 NOTE — Patient Instructions (Signed)

## 2015-12-27 ENCOUNTER — Ambulatory Visit
Admission: RE | Admit: 2015-12-27 | Discharge: 2015-12-27 | Disposition: A | Discharge status: Home / Self Care | Payer: BLUE CROSS/BLUE SHIELD | Source: Ambulatory Visit | Attending: Obstetrics and Gynecology | Admitting: Obstetrics and Gynecology

## 2015-12-27 ENCOUNTER — Other Ambulatory Visit: Payer: Self-pay | Admitting: Obstetrics and Gynecology

## 2015-12-27 DIAGNOSIS — Z1231 Encounter for screening mammogram for malignant neoplasm of breast: Secondary | ICD-10-CM | POA: Diagnosis not present

## 2015-12-27 DIAGNOSIS — Z1239 Encounter for other screening for malignant neoplasm of breast: Secondary | ICD-10-CM

## 2016-01-30 ENCOUNTER — Ambulatory Visit (INDEPENDENT_AMBULATORY_CARE_PROVIDER_SITE_OTHER): Payer: BLUE CROSS/BLUE SHIELD | Admitting: Family Medicine

## 2016-01-30 ENCOUNTER — Encounter: Payer: Self-pay | Admitting: Family Medicine

## 2016-01-30 VITALS — BP 120/82 | HR 96 | Temp 98.3°F | Resp 16

## 2016-01-30 DIAGNOSIS — J069 Acute upper respiratory infection, unspecified: Secondary | ICD-10-CM

## 2016-01-30 DIAGNOSIS — B9789 Other viral agents as the cause of diseases classified elsewhere: Secondary | ICD-10-CM | POA: Diagnosis not present

## 2016-01-30 MED ORDER — CEFDINIR 300 MG PO CAPS: 300.0000 mg | ORAL_CAPSULE | Freq: Two times a day (BID) | ORAL | 0 refills | Status: DC

## 2016-01-30 NOTE — Progress Notes (Signed)
Subjective:     Patient ID: Girard CooterConnie M Wellbrock, female   DOB: 08/13/1960, 56 y.o.   MRN: [id number]  HPI  Chief Complaint  Patient presents with  . Sinus Problem    Patient comes in office today with complaints of sinus pain and pressure since 01/26/16. Patient reports post nasal drip, sore throat and pain in both ears, patient also complains of yellow nasal drainage. Patient has been taking otc Mucinex, Sudafed and Nettie Pot   States she has had prior sinus surgery. Accompanied by her grand-daughter today   Review of Systems     Objective:   Physical Exam  Constitutional: She appears well-developed and well-nourished. No distress.  Ears: T.M's intact without inflammation Sinuses: mild paranasal sinus tenderness Throat: tonsils absent Neck: right anterior cervical tenderness Lungs: clear     Assessment:    1. Viral upper respiratory tract infection - cefdinir (OMNICEF) 300 MG capsule; Take 1 capsule (300 mg total) by mouth 2 (two) times daily.  Dispense: 20 capsule; Refill: 0    Plan:    Continue with current medication and add topical decongestants x 3 days. Will start abx if sinuses not improving over the next few days.

## 2016-01-30 NOTE — Patient Instructions (Addendum)
Continue Mucinex  and Sudafed (or Mucinex D) along with Neti Pot. Add a nasal decongestant spray like Afrin for 3 days. If sinuses not improving by the end of the the week start the antibiotic.

## 2016-03-07 DIAGNOSIS — M25511 Pain in right shoulder: Secondary | ICD-10-CM | POA: Diagnosis not present

## 2016-03-07 DIAGNOSIS — M7581 Other shoulder lesions, right shoulder: Secondary | ICD-10-CM | POA: Diagnosis not present

## 2016-04-25 ENCOUNTER — Encounter: Payer: BLUE CROSS/BLUE SHIELD | Admitting: Obstetrics and Gynecology

## 2016-05-01 ENCOUNTER — Encounter: Payer: BLUE CROSS/BLUE SHIELD | Admitting: Obstetrics and Gynecology

## 2016-05-14 NOTE — Progress Notes (Signed)
GYN ANNUAL PREVENTATIVE CARE ENCOUNTER NOTE  Subjective:       Kristin Little is a 56 y.o. g1 p1001. female here for a routine annual gynecologic exam.  Current complaints:  1.  Not sleeping well-Taking melatonin 2 tabs at night   Gynecologic History No LMP recorded. Patient is postmenopausal. Contraception: post menopausal status Last Pap: 2015 negative/negative Last mammogram: BI-RADS 1- 12/2015  Menarche-age History of irregular cycles without dysmenorrhea Family history of endometriosis -sister No history of abnormal Pap smear No History of abnormal mammograms Endometrial ablation 2008  Obstetric History OB History  No data available    Past Medical History:  Diagnosis Date  . Abnormal uterine bleeding (AUB)   . Anxiety   . Chronic fatigue   . Environmental allergies   . Insomnia   . Perimenopausal   . Squamous cell carcinoma     Past Surgical History:  Procedure Laterality Date  . AUGMENTATION MAMMAPLASTY  2012  . COLONOSCOPY WITH PROPOFOL N/A 07/04/2015   Procedure: COLONOSCOPY WITH PROPOFOL;  Surgeon: Midge Minium, MD;  Location: [facility];  Service: Endoscopy;  Laterality: N/A;  . DILATION AND CURETTAGE OF UTERUS    . ENDOMETRIAL ABLATION    . NASAL SINUS SURGERY    . skin cancer removed    . TONSILLECTOMY      Current Outpatient Prescriptions on File Prior to Visit  Medication Sig Dispense Refill  . Calcium-Vitamin D-Vitamin K (VIACTIV PO) Take by mouth daily.    . cefdinir (OMNICEF) 300 MG capsule Take 1 capsule (300 mg total) by mouth 2 (two) times daily. 20 capsule 0  . conjugated estrogens (PREMARIN) vaginal cream Place 1 Applicatorful vaginally daily.    . norethindrone-ethinyl estradiol (FEMHRT LOW DOSE) 0.5-2.5 MG-MCG tablet Take 1 tablet by mouth daily. 30 tablet 6   No current facility-administered medications on file prior to visit.     No Known Allergies  Social History   Social History  . Marital status: Married    Spouse  name: N/A  . Number of children: N/A  . Years of education: N/A   Occupational History  . Not on file.   Social History Main Topics  . Smoking status: Never Smoker  . Smokeless tobacco: Never Used  . Alcohol use Yes     Comment: occas - 4 drinks/mo  . Drug use: No  . Sexual activity: Yes    Birth control/ protection: None   Other Topics Concern  . Not on file   Social History Narrative  . No narrative on file    Family History  Problem Relation Age of Onset  . Breast cancer Mother 44  . Hypertension Father   . Hypothyroidism Father   . Endometriosis Sister   . Seizures Brother   . Diabetes Neg Hx   . Ovarian cancer Neg Hx     The following portions of the patient's history were reviewed and updated as appropriate: allergies, current medications, past family history, past medical history, past social history, past surgical history and problem list.  Review of Systems Review of Systems  Constitutional: Negative.  Negative for chills, diaphoresis, fever and malaise/fatigue.  HENT: Positive for congestion.   Respiratory: Negative.  Negative for cough and shortness of breath.   Cardiovascular: Negative.  Negative for chest pain, palpitations, claudication and leg swelling.  Gastrointestinal: Negative for abdominal pain, blood in stool, constipation, diarrhea, nausea and vomiting.  Genitourinary: Negative.  Negative for dysuria, flank pain, frequency, hematuria and urgency.  Musculoskeletal:       Right shoulder pain  Skin: Positive for rash. Negative for itching.  Neurological: Negative.  Negative for dizziness, weakness and headaches.  Endo/Heme/Allergies: Negative.   Psychiatric/Behavioral: The patient has insomnia.      Objective:  BP 123/75   Pulse 89   Ht  (1.676 m)   Wt 138 lb 1.6 oz (62.6 kg)   BMI 22.29 kg/m   Physical Exam  Constitutional: She is oriented to person, place, and time. She appears well-developed and well-nourished.  HENT:  Head:  Normocephalic and atraumatic.  Eyes: Conjunctivae are normal.  Neck: Normal range of motion. Neck supple. No tracheal deviation present. No thyromegaly present.  Non-tender right cervical adenopathy  Cardiovascular: Normal rate, regular rhythm, normal heart sounds and intact distal pulses.   No murmur heard. Pulmonary/Chest: Effort normal and breath sounds normal. She has no wheezes. She has no rales.  Abdominal: Soft. She exhibits no distension and no mass. There is no tenderness. There is no guarding. No hernia.  Genitourinary:  Genitourinary Comments: External genitalia normal BUS normal Gynecologic normal Cervix-normal Uterus-midplane, normal size and shape, mobile, nontender Adnexa-nonpalpable and nontender Rectovaginal-external nonthrombosed hemorrhoid; normal sphincter tone; no rectal masses  Musculoskeletal: Normal range of motion. She exhibits tenderness. She exhibits no edema.  Right shoulder tenderness  Lymphadenopathy:    She has cervical adenopathy.  Neurological: She is alert and oriented to person, place, and time.  Skin: Skin is warm and dry. Capillary refill takes less than 2 seconds. Rash noted.  Rash noted on bilateral lower extremities  Psychiatric: She has a normal mood and affect. Her behavior is normal. Thought content normal.  Vitals reviewed. Breasts: Bilaterally symmetric without dominant masses adenopathy or nipple discharge; saline implants present  Assessment:   Annual gynecologic examination 56 y.o. Contraception: post menopausal status Normal BMI Menopause, asymptomatic on HRT History of recurrent UTI, no recent infection  Plan:  Pap: pap w/hpv Mammogram: Ordered Labs:lipid vit d tsh a1c fbs Stool cards- utd 06/2015 colonoscopy Routine preventative health maintenance measures emphasized: Diet/Weight control, Tobacco Cessation and Alcohol/Drug use Refill FemHRT Encourage calcium with vitamin D 1200 mg a day Return to Clinic - 1 Year   [employer], CMA  Herold Harms, MD   Note: This dictation was prepared with Dragon dictation along with smaller phrase technology. Any transcriptional errors that result from this process are unintentional.

## 2016-05-15 ENCOUNTER — Other Ambulatory Visit: Payer: Self-pay | Admitting: Obstetrics and Gynecology

## 2016-05-15 ENCOUNTER — Ambulatory Visit (INDEPENDENT_AMBULATORY_CARE_PROVIDER_SITE_OTHER): Payer: BLUE CROSS/BLUE SHIELD | Admitting: Obstetrics and Gynecology

## 2016-05-15 ENCOUNTER — Encounter: Payer: Self-pay | Admitting: Obstetrics and Gynecology

## 2016-05-15 VITALS — BP 123/75 | HR 89 | Ht 66.0 in | Wt 138.1 lb

## 2016-05-15 DIAGNOSIS — Z1231 Encounter for screening mammogram for malignant neoplasm of breast: Secondary | ICD-10-CM | POA: Diagnosis not present

## 2016-05-15 DIAGNOSIS — Z9889 Other specified postprocedural states: Secondary | ICD-10-CM

## 2016-05-15 DIAGNOSIS — Z1239 Encounter for other screening for malignant neoplasm of breast: Secondary | ICD-10-CM

## 2016-05-15 DIAGNOSIS — Z780 Asymptomatic menopausal state: Secondary | ICD-10-CM | POA: Diagnosis not present

## 2016-05-15 DIAGNOSIS — Z01419 Encounter for gynecological examination (general) (routine) without abnormal findings: Secondary | ICD-10-CM | POA: Diagnosis not present

## 2016-05-15 DIAGNOSIS — G4700 Insomnia, unspecified: Secondary | ICD-10-CM

## 2016-05-15 MED ORDER — NORETHINDRONE-ETH ESTRADIOL 0.5-2.5 MG-MCG PO TABS: 1.0000 | ORAL_TABLET | Freq: Every day | ORAL | 3 refills | Status: DC

## 2016-05-15 NOTE — Patient Instructions (Signed)
1. Pap smear is done 2. Mammogram due in December 2018 3. Stool guaiac cards deferred due to colonoscopy this year 4. Continue with healthy eating and exercise 5. Continue with calcium and vitamin D supplementation daily 6. FEM hrt is refilled for 1 year 7. Return in 1 year for annual exam  No Health Maintenance for Postmenopausal Women Menopause is a normal process in which your reproductive ability comes to an end. This process happens gradually over a span of months to years, usually between the ages of 32 and 59. Menopause is complete when you have missed 12 consecutive menstrual periods. It is important to talk with your health care provider about some of the most common conditions that affect postmenopausal women, such as heart disease, cancer, and bone loss (osteoporosis). Adopting a healthy lifestyle and getting preventive care can help to promote your health and wellness. Those actions can also lower your chances of developing some of these common conditions. What should I know about menopause? During menopause, you may experience a number of symptoms, such as:  Moderate-to-severe hot flashes.  Night sweats.  Decrease in sex drive.  Mood swings.  Headaches.  Tiredness.  Irritability.  Memory problems.  Insomnia. Choosing to treat or not to treat menopausal changes is an individual decision that you make with your health care provider. What should I know about hormone replacement therapy and supplements? Hormone therapy products are effective for treating symptoms that are associated with menopause, such as hot flashes and night sweats. Hormone replacement carries certain risks, especially as you become older. If you are thinking about using estrogen or estrogen with progestin treatments, discuss the benefits and risks with your health care provider. What should I know about heart disease and stroke? Heart disease, heart attack, and stroke become more likely as you age.  This may be due, in part, to the hormonal changes that your body experiences during menopause. These can affect how your body processes dietary fats, triglycerides, and cholesterol. Heart attack and stroke are both medical emergencies. There are many things that you can do to help prevent heart disease and stroke:  Have your blood pressure checked at least every 1-2 years. High blood pressure causes heart disease and increases the risk of stroke.  If you are 69-65 years old, ask your health care provider if you should take aspirin to prevent a heart attack or a stroke.  Do not use any tobacco products, including cigarettes, chewing tobacco, or electronic cigarettes. If you need help quitting, ask your health care provider.  It is important to eat a healthy diet and maintain a healthy weight.  Be sure to include plenty of vegetables, fruits, low-fat dairy products, and lean protein.  Avoid eating foods that are high in solid fats, added sugars, or salt (sodium).  Get regular exercise. This is one of the most important things that you can do for your health.  Try to exercise for at least 150 minutes each week. The type of exercise that you do should increase your heart rate and make you sweat. This is known as moderate-intensity exercise.  Try to do strengthening exercises at least twice each week. Do these in addition to the moderate-intensity exercise.  Know your numbers.Ask your health care provider to check your cholesterol and your blood glucose. Continue to have your blood tested as directed by your health care provider. What should I know about cancer screening? There are several types of cancer. Take the following steps to reduce your risk  and to catch any cancer development as early as possible. Breast Cancer  Practice breast self-awareness.  This means understanding how your breasts normally appear and feel.  It also means doing regular breast self-exams. Let your health care  provider know about any changes, no matter how small.  If you are 53 or older, have a clinician do a breast exam (clinical breast exam or CBE) every year. Depending on your age, family history, and medical history, it may be recommended that you also have a yearly breast X-ray (mammogram).  If you have a family history of breast cancer, talk with your health care provider about genetic screening.  If you are at high risk for breast cancer, talk with your health care provider about having an MRI and a mammogram every year.  Breast cancer (BRCA) gene test is recommended for women who have family members with BRCA-related cancers. Results of the assessment will determine the need for genetic counseling and BRCA1 and for BRCA2 testing. BRCA-related cancers include these types:  Breast. This occurs in males or females.  Ovarian.  Tubal. This may also be called fallopian tube cancer.  Cancer of the abdominal or pelvic lining (peritoneal cancer).  Prostate.  Pancreatic. Cervical, Uterine, and Ovarian Cancer  Your health care provider may recommend that you be screened regularly for cancer of the pelvic organs. These include your ovaries, uterus, and vagina. This screening involves a pelvic exam, which includes checking for microscopic changes to the surface of your cervix (Pap test).  For women ages 21-65, health care providers may recommend a pelvic exam and a Pap test every three years. For women ages 68-65, they may recommend the Pap test and pelvic exam, combined with testing for human papilloma virus (HPV), every five years. Some types of HPV increase your risk of cervical cancer. Testing for HPV may also be done on women of any age who have unclear Pap test results.  Other health care providers may not recommend any screening for nonpregnant women who are considered low risk for pelvic cancer and have no symptoms. Ask your health care provider if a screening pelvic exam is right for  you.  If you have had past treatment for cervical cancer or a condition that could lead to cancer, you need Pap tests and screening for cancer for at least 20 years after your treatment. If Pap tests have been discontinued for you, your risk factors (such as having a new sexual partner) need to be reassessed to determine if you should start having screenings again. Some women have medical problems that increase the chance of getting cervical cancer. In these cases, your health care provider may recommend that you have screening and Pap tests more often.  If you have a family history of uterine cancer or ovarian cancer, talk with your health care provider about genetic screening.  If you have vaginal bleeding after reaching menopause, tell your health care provider.  There are currently no reliable tests available to screen for ovarian cancer. Lung Cancer  Lung cancer screening is recommended for adults 74-37 years old who are at high risk for lung cancer because of a history of smoking. A yearly low-dose CT scan of the lungs is recommended if you:  Currently smoke.  Have a history of at least 30 pack-years of smoking and you currently smoke or have quit within the past 15 years. A pack-year is smoking an average of one pack of cigarettes per day for one year. Yearly screening should:  Continue until it has been 15 years since you quit.  Stop if you develop a health problem that would prevent you from having lung cancer treatment. Colorectal Cancer  This type of cancer can be detected and can often be prevented.  Routine colorectal cancer screening usually begins at age 49 and continues through age 25.  If you have risk factors for colon cancer, your health care provider may recommend that you be screened at an earlier age.  If you have a family history of colorectal cancer, talk with your health care provider about genetic screening.  Your health care provider may also recommend using  home test kits to check for hidden blood in your stool.  A small camera at the end of a tube can be used to examine your colon directly (sigmoidoscopy or colonoscopy). This is done to check for the earliest forms of colorectal cancer.  Direct examination of the colon should be repeated every 5-10 years until age 86. However, if early forms of precancerous polyps or small growths are found or if you have a family history or genetic risk for colorectal cancer, you may need to be screened more often. Skin Cancer  Check your skin from head to toe regularly.  Monitor any moles. Be sure to tell your health care provider:  About any new moles or changes in moles, especially if there is a change in a mole's shape or color.  If you have a mole that is larger than the size of a pencil eraser.  If any of your family members has a history of skin cancer, especially at a young age, talk with your health care provider about genetic screening.  Always use sunscreen. Apply sunscreen liberally and repeatedly throughout the day.  Whenever you are outside, protect yourself by wearing long sleeves, pants, a wide-brimmed hat, and sunglasses. What should I know about osteoporosis? Osteoporosis is a condition in which bone destruction happens more quickly than new bone creation. After menopause, you may be at an increased risk for osteoporosis. To help prevent osteoporosis or the bone fractures that can happen because of osteoporosis, the following is recommended:  If you are 36-82 years old, get at least 1,000 mg of calcium and at least 600 mg of vitamin D per day.  If you are older than age 18 but younger than age 39, get at least 1,200 mg of calcium and at least 600 mg of vitamin D per day.  If you are older than age 39, get at least 1,200 mg of calcium and at least 800 mg of vitamin D per day. Smoking and excessive alcohol intake increase the risk of osteoporosis. Eat foods that are rich in calcium and  vitamin D, and do weight-bearing exercises several times each week as directed by your health care provider. What should I know about how menopause affects my mental health? Depression may occur at any age, but it is more common as you become older. Common symptoms of depression include:  Low or sad mood.  Changes in sleep patterns.  Changes in appetite or eating patterns.  Feeling an overall lack of motivation or enjoyment of activities that you previously enjoyed.  Frequent crying spells. Talk with your health care provider if you think that you are experiencing depression. What should I know about immunizations? It is important that you get and maintain your immunizations. These include:  Tetanus, diphtheria, and pertussis (Tdap) booster vaccine.  Influenza every year before the flu season begins.  Pneumonia  vaccine.  Shingles vaccine. Your health care provider may also recommend other immunizations. This information is not intended to replace advice given to you by your health care provider. Make sure you discuss any questions you have with your health care provider. Document Released: 02/23/2005 Document Revised: 07/22/2015 Document Reviewed: 10/05/2014 Elsevier Interactive Patient Education  2017 [employer].

## 2016-05-17 LAB — PAP IG AND HPV HIGH-RISK
HPV, HIGH-RISK: NEGATIVE
PAP SMEAR COMMENT: 0

## 2016-05-17 LAB — PLEASE NOTE

## 2016-06-06 ENCOUNTER — Other Ambulatory Visit (INDEPENDENT_AMBULATORY_CARE_PROVIDER_SITE_OTHER): Payer: BLUE CROSS/BLUE SHIELD

## 2016-06-06 ENCOUNTER — Encounter: Payer: BLUE CROSS/BLUE SHIELD | Admitting: Obstetrics and Gynecology

## 2016-06-06 DIAGNOSIS — R319 Hematuria, unspecified: Secondary | ICD-10-CM

## 2016-06-06 DIAGNOSIS — R300 Dysuria: Secondary | ICD-10-CM

## 2016-06-06 LAB — POCT URINALYSIS DIPSTICK
Bilirubin, UA: NEGATIVE
GLUCOSE UA: NEGATIVE
Ketones, UA: NEGATIVE
Nitrite, UA: POSITIVE
Spec Grav, UA: 1.025 (ref 1.010–1.025)
Urobilinogen, UA: 0.2 E.U./dL
pH, UA: 6 (ref 5.0–8.0)

## 2016-06-06 MED ORDER — NITROFURANTOIN MONOHYD MACRO 100 MG PO CAPS: 100.0000 mg | ORAL_CAPSULE | Freq: Two times a day (BID) | ORAL | 0 refills | Status: DC

## 2016-06-08 LAB — URINE CULTURE

## 2016-06-25 DIAGNOSIS — Z85828 Personal history of other malignant neoplasm of skin: Secondary | ICD-10-CM | POA: Diagnosis not present

## 2016-06-25 DIAGNOSIS — L570 Actinic keratosis: Secondary | ICD-10-CM | POA: Diagnosis not present

## 2016-10-19 ENCOUNTER — Other Ambulatory Visit: Payer: Self-pay

## 2016-10-19 MED ORDER — ESTROGENS, CONJUGATED 0.625 MG/GM VA CREA: 0.2500 | TOPICAL_CREAM | VAGINAL | 3 refills | Status: DC

## 2016-10-25 ENCOUNTER — Encounter: Payer: Self-pay | Admitting: Obstetrics and Gynecology

## 2016-10-25 ENCOUNTER — Ambulatory Visit (INDEPENDENT_AMBULATORY_CARE_PROVIDER_SITE_OTHER): Payer: BLUE CROSS/BLUE SHIELD | Admitting: Obstetrics and Gynecology

## 2016-10-25 VITALS — BP 148/79 | HR 82 | Ht 66.0 in | Wt 138.0 lb

## 2016-10-25 DIAGNOSIS — B373 Candidiasis of vulva and vagina: Secondary | ICD-10-CM

## 2016-10-25 DIAGNOSIS — N898 Other specified noninflammatory disorders of vagina: Secondary | ICD-10-CM | POA: Diagnosis not present

## 2016-10-25 DIAGNOSIS — B3731 Acute candidiasis of vulva and vagina: Secondary | ICD-10-CM

## 2016-10-25 MED ORDER — TERCONAZOLE 0.4 % VA CREA: 1.0000 | TOPICAL_CREAM | Freq: Every day | VAGINAL | 0 refills | Status: DC

## 2016-10-25 MED ORDER — NYSTATIN-TRIAMCINOLONE 100000-0.1 UNIT/GM-% EX CREA: 1.0000 "application " | TOPICAL_CREAM | Freq: Two times a day (BID) | CUTANEOUS | 0 refills | Status: DC

## 2016-10-25 NOTE — Progress Notes (Signed)
Chief complaint: 1. Recurrent vulvar irritation  Kristin Little presents today for evaluation of vaginitis. Approximately 1 month ago she self treated with Monistat because of significant irritation in the vulva and vagina region after restarting Premarin cream therapy. She noted that the Premarin cream was out of date. She did have mild symptom relief temporarily. However, symptoms have recurred over the past week with severe external and internal irritation.  She has not been on any new medications recently. She has not had any screening labs for diabetes.  Past medical history, past surgical history, problem list, medications, and allergies are reviewed  OBJECTIVE: BP (!) 148/79   Pulse 82   Ht  (1.676 m)   Wt 138 lb (62.6 kg)   BMI 22.27 kg/m  Pleasant well-appearing female in no acute distress. Alert and oriented. Pelvic exam: External genitalia-labia majora and minora are normal; perianal region is notable for satellite lesions consistent with monilia BUS-normal Vagina-cottage cheese discharge within the vagina Cervix-no lesions Uterus-not examined Bladder-nontender Rectovaginal-perianal rash as noted  ASSESSMENT: 1. Monilia vulvovaginitis  PLAN: 1. Terazol 7 cream intravaginal 7 days 2. Nystatin/triamcinolone cream topically to the vulva and perianal region twice a day for 14 days 3. Hemoglobin A1c 4. Annual exam in May 2019  A total of 15 minutes were spent face-to-face with the patient during this encounter and over half of that time dealt with counseling and coordination of care.  Herold Harms, MD  Note: This dictation was prepared with Dragon dictation along with smaller phrase technology. Any transcriptional errors that result from this process are unintentional.

## 2016-10-25 NOTE — Patient Instructions (Addendum)
1. Terazol 7 cream intravaginal daily 7 days 2. Apply nystatin/triamcinolone cream topically to the vulva twice a day for the next 14 days 3. Hemoglobin A1c is obtained today Vaginal Yeast infection, Adult Vaginal yeast infection is a condition that causes soreness, swelling, and redness (inflammation) of the vagina. It also causes vaginal discharge. This is a common condition. Some women get this infection frequently. What are the causes? This condition is caused by a change in the normal balance of the yeast (candida) and bacteria that live in the vagina. This change causes an overgrowth of yeast, which causes the inflammation. What increases the risk? This condition is more likely to develop in:  Women who take antibiotic medicines.  Women who have diabetes.  Women who take birth control pills.  Women who are pregnant.  Women who douche often.  Women who have a weak defense (immune) system.  Women who have been taking steroid medicines for a long time.  Women who frequently wear tight clothing.  What are the signs or symptoms? Symptoms of this condition include:  White, thick vaginal discharge.  Swelling, itching, redness, and irritation of the vagina. The lips of the vagina (vulva) may be affected as well.  Pain or a burning feeling while urinating.  Pain during sex.  How is this diagnosed? This condition is diagnosed with a medical history and physical exam. This will include a pelvic exam. Your health care provider will examine a sample of your vaginal discharge under a microscope. Your health care provider may send this sample for testing to confirm the diagnosis. How is this treated? This condition is treated with medicine. Medicines may be over-the-counter or prescription. You may be told to use one or more of the following:  Medicine that is taken orally.  Medicine that is applied as a cream.  Medicine that is inserted directly into the vagina  (suppository).  Follow these instructions at home:  Take or apply over-the-counter and prescription medicines only as told by your health care provider.  Do not have sex until your health care provider has approved. Tell your sex partner that you have a yeast infection. That person should go to his or her health care provider if he or she develops symptoms.  Do not wear tight clothes, such as pantyhose or tight pants.  Avoid using tampons until your health care provider approves.  Eat more yogurt. This may help to keep your yeast infection from returning.  Try taking a sitz bath to help with discomfort. This is a warm water bath that is taken while you are sitting down. The water should only come up to your hips and should cover your buttocks. Do this 3-4 times per day or as told by your health care provider.  Do not douche.  Wear breathable, cotton underwear.  If you have diabetes, keep your blood sugar levels under control. Contact a health care provider if:  You have a fever.  Your symptoms go away and then return.  Your symptoms do not get better with treatment.  Your symptoms get worse.  You have new symptoms.  You develop blisters in or around your vagina.  You have blood coming from your vagina and it is not your menstrual period.  You develop pain in your abdomen. This information is not intended to replace advice given to you by your health care provider. Make sure you discuss any questions you have with your health care provider. Document Released: 10/11/2004 Document Revised: 06/15/2015 Document Reviewed:  07/05/2014 Elsevier Interactive Patient Education  [employer].

## 2016-10-26 LAB — HEMOGLOBIN A1C
Est. average glucose Bld gHb Est-mCnc: 103 mg/dL
HEMOGLOBIN A1C: 5.2 % (ref 4.8–5.6)

## 2016-11-11 LAB — CANDIDA 6 SPECIES PROFILE, NAA
CANDIDA ALBICANS, NAA: POSITIVE — AB
CANDIDA PARAPSILOSIS, NAA: NEGATIVE
CANDIDA TROPICALIS, NAA: NEGATIVE
Candida glabrata, NAA: NEGATIVE
Candida krusei, NAA: NEGATIVE
Candida lusitaniae, NAA: NEGATIVE

## 2016-12-12 DIAGNOSIS — M7582 Other shoulder lesions, left shoulder: Secondary | ICD-10-CM | POA: Diagnosis not present

## 2016-12-31 ENCOUNTER — Other Ambulatory Visit: Payer: Self-pay | Admitting: Obstetrics and Gynecology

## 2016-12-31 ENCOUNTER — Ambulatory Visit
Admission: RE | Admit: 2016-12-31 | Discharge: 2016-12-31 | Disposition: A | Discharge status: Home / Self Care | Payer: BLUE CROSS/BLUE SHIELD | Source: Ambulatory Visit | Attending: Obstetrics and Gynecology | Admitting: Obstetrics and Gynecology

## 2016-12-31 DIAGNOSIS — Z1239 Encounter for other screening for malignant neoplasm of breast: Secondary | ICD-10-CM

## 2016-12-31 DIAGNOSIS — Z1231 Encounter for screening mammogram for malignant neoplasm of breast: Secondary | ICD-10-CM | POA: Insufficient documentation

## 2017-01-01 ENCOUNTER — Other Ambulatory Visit: Payer: Self-pay | Admitting: Obstetrics and Gynecology

## 2017-01-01 DIAGNOSIS — N6489 Other specified disorders of breast: Secondary | ICD-10-CM

## 2017-01-01 DIAGNOSIS — R928 Other abnormal and inconclusive findings on diagnostic imaging of breast: Secondary | ICD-10-CM

## 2017-01-10 ENCOUNTER — Ambulatory Visit
Admission: RE | Admit: 2017-01-10 | Discharge: 2017-01-10 | Disposition: A | Discharge status: Home / Self Care | Payer: BLUE CROSS/BLUE SHIELD | Source: Ambulatory Visit | Attending: Obstetrics and Gynecology | Admitting: Obstetrics and Gynecology

## 2017-01-10 DIAGNOSIS — R928 Other abnormal and inconclusive findings on diagnostic imaging of breast: Secondary | ICD-10-CM | POA: Insufficient documentation

## 2017-01-10 DIAGNOSIS — N6489 Other specified disorders of breast: Secondary | ICD-10-CM | POA: Diagnosis not present

## 2017-04-04 ENCOUNTER — Other Ambulatory Visit: Payer: Self-pay | Admitting: Obstetrics and Gynecology

## 2017-04-04 ENCOUNTER — Telehealth: Payer: Self-pay | Admitting: Obstetrics and Gynecology

## 2017-04-04 NOTE — Telephone Encounter (Signed)
Pt calls stating she will need a refill on generic Femhrt in a couple of weeks; went to the pharmacy today to get a refill and was told they couldn't get it - stated this was nationwide. Is there an alternative med for her to take?

## 2017-04-08 ENCOUNTER — Telehealth: Payer: Self-pay | Admitting: Obstetrics and Gynecology

## 2017-04-08 NOTE — Telephone Encounter (Signed)
The patient called and stated that  Her pharmacy stated that there is a nation wide shortage of the medication she needs, and the patient needs to speak with Darol Destinerystal Miller in regards to the medication problem. Please advise.

## 2017-04-08 NOTE — Telephone Encounter (Signed)
Per DC pt has her rx.

## 2017-05-27 NOTE — Progress Notes (Signed)
GYN ANNUAL PREVENTATIVE CARE ENCOUNTER NOTE  Subjective:       Kristin Little is a 57 y.o. g1 p1001. female here for a routine annual gynecologic exam.  Current complaints:   Vaginal dryness- not using premarin as rxed; intercourse is uncomfortable with dryness and dyspareunia, not currently using lubricants.  HRT is being used systemically and desires to be continued.  Bowel function is normal.  Bladder function is normal.  Patient has dermatology appointment for follow-up on multiple moles   Gynecologic History No LMP recorded. Patient is postmenopausal. Contraception: post menopausal status Last Pap: 05/15/2016 negative/negative Last mammogram: [facility] 1- 12/2016  Menarche-age History of irregular cycles without dysmenorrhea Family history of endometriosis -sister No history of abnormal Pap smear No History of abnormal mammograms Endometrial ablation 2008  Obstetric History OB History  Gravida Para Term Preterm AB Living  SAB TAB Ectopic Multiple Live Births          1    # Outcome Date GA Lbr Len/2nd Weight Sex Delivery Anes PTL Lv  1 Term 1986   6 lb 1.6 oz (2.767 kg) F Vag-Spont   LIV    Past Medical History:  Diagnosis Date  . Abnormal uterine bleeding (AUB)   . Anxiety   . Arthritis    shoulder  . Chronic fatigue   . Environmental allergies   . Insomnia   . Perimenopausal   . Squamous cell carcinoma     Past Surgical History:  Procedure Laterality Date  . AUGMENTATION MAMMAPLASTY  2012  . COLONOSCOPY WITH PROPOFOL N/A 07/04/2015   Procedure: COLONOSCOPY WITH PROPOFOL;  Surgeon: Midge Minium, MD;  Location: [facility];  Service: Endoscopy;  Laterality: N/A;  . DILATION AND CURETTAGE OF UTERUS    . ENDOMETRIAL ABLATION    . NASAL SINUS SURGERY    . skin cancer removed    . TONSILLECTOMY      Current Outpatient Medications on File Prior to Visit  Medication Sig Dispense Refill  . Calcium-Vitamin D-Vitamin K (VIACTIV PO) Take by  mouth daily.    Marland Kitchen conjugated estrogens (PREMARIN) vaginal cream Place 0.25 Applicatorfuls vaginally 2 (two) times a week. 42.5 g 3  . JEVANTIQUE LO 0.5-2.5 MG-MCG tablet TAKE 1 TABLET BY MOUTH DAILY 84 tablet 0  . nystatin-triamcinolone (MYCOLOG II) cream Apply 1 application topically 2 (two) times daily. externally 30 g 0  . terconazole (TERAZOL 7) 0.4 % vaginal cream Place 1 applicator vaginally at bedtime. X 7nights 45 g 0   No current facility-administered medications on file prior to visit.     No Known Allergies  Social History   Socioeconomic History  . Marital status: Married    Spouse name: Not on file  . Number of children: Not on file  . Years of education: Not on file  . Highest education level: Not on file  Occupational History  . Not on file  Social Needs  . Financial resource strain: Not on file  . Food insecurity:    Worry: Not on file    Inability: Not on file  . Transportation needs:    Medical: Not on file    Non-medical: Not on file  Tobacco Use  . Smoking status: Never Smoker  . Smokeless tobacco: Never Used  Substance and Sexual Activity  . Alcohol use: Yes    Comment: occas - 4 drinks/mo  . Drug use: No  . Sexual activity: Yes  Birth control/protection: None  Lifestyle  . Physical activity:    Days per week: Not on file    Minutes per session: Not on file  . Stress: Not on file  Relationships  . Social connections:    Talks on phone: Not on file    Gets together: Not on file    Attends religious service: Not on file    Active member of club or organization: Not on file    Attends meetings of clubs or organizations: Not on file    Relationship status: Not on file  . Intimate partner violence:    Fear of current or ex partner: Not on file    Emotionally abused: Not on file    Physically abused: Not on file    Forced sexual activity: Not on file  Other Topics Concern  . Not on file  Social History Narrative  . Not on file    Family  History  Problem Relation Age of Onset  . Breast cancer Mother 44  . Hypertension Father   . Hypothyroidism Father   . Endometriosis Sister   . Seizures Brother   . Diabetes Neg Hx   . Ovarian cancer Neg Hx     The following portions of the patient's history were reviewed and updated as appropriate: allergies, current medications, past family history, past medical history, past social history, past surgical history and problem list.  Review of Systems  Review of Systems  Constitutional:       Vasomotor symptoms-no significant episodes  HENT: Negative.   Eyes: Negative.   Respiratory: Negative.   Cardiovascular: Negative.   Gastrointestinal: Negative.   Genitourinary: Negative.        Dyspareunia Vaginal dryness Not using Premarin cream as directed  Musculoskeletal: Negative.   Skin:       Multiple macules and papules on back; has dermatology visit scheduled next month  Neurological: Negative.   Endo/Heme/Allergies: Negative.   Psychiatric/Behavioral: Negative.     Objective:  BP 120/77   Pulse 80   Ht  (1.676 m)   Wt 131 lb (59.4 kg)   BMI 21.14 kg/m  Physical Exam  BP 120/77   Pulse 80   Ht  (1.676 m)   Wt 131 lb (59.4 kg)   BMI 21.14 kg/m  Pleasant well-appearing female no acute distress.  Alert and oriented. HEENT exam: Normocephalic atraumatic; pupils equal round and reactive; EOMI; no scleral icterus Neck: No adenopathy or thyromegaly Lungs: Clear Heart: Regular rate and rhythm without murmur Breasts: Bilateral symmetric without dominant mass adenopathy or nipple discharge; nipples are everted; breast implants are present. Abdomen: Soft, nontender without organomegaly Pelvic: External genitalia-normal BUS-normal Vagina-mild to moderate atrophy present; no discharge Cervix-no lesions; no cervical motion tenderness Uterus-normal size shape and mobility; midplane; nontender Adnexa-nonpalpable man nontender Rectovaginal-normal external exam;  sphincter tone is normal; no rectal masses Extremities: Warm dry without edema Skin: Multiple macules and papules on back, none suspicious  Assessment:   Annual gynecologic examination 57 y.o. Contraception: post menopausal status Normal BMI Menopause, asymptomatic on HRT History of recurrent UTI, no recent infection  Plan:  Pap: Due 2021 Mammogram: Ordered Labs:lipid vit d tsh a1c fbs Stool cards- ordered Routine preventative health maintenance measures emphasized: Diet/Weight control, Tobacco Cessation and Alcohol/Drug use Refill FemHRT equivalent Encourage calcium with vitamin D 1200 mg a day Return to Clinic - 1 Year Keep dermatology follow-up regarding skin survey Over-the-counter lubricants for vaginal dryness reviewed Patient is encouraged to be consistent  with Premarin cream use for vaginal atrophy   Darol Destine, CMA  Herold Harms, MD  Note: This dictation was prepared with Dragon dictation along with smaller phrase technology. Any transcriptional errors that result from this process are unintentional.

## 2017-05-29 ENCOUNTER — Encounter: Payer: Self-pay | Admitting: Obstetrics and Gynecology

## 2017-05-29 ENCOUNTER — Ambulatory Visit (INDEPENDENT_AMBULATORY_CARE_PROVIDER_SITE_OTHER): Payer: BLUE CROSS/BLUE SHIELD | Admitting: Obstetrics and Gynecology

## 2017-05-29 VITALS — BP 120/77 | HR 80 | Ht 66.0 in | Wt 131.0 lb

## 2017-05-29 DIAGNOSIS — Z780 Asymptomatic menopausal state: Secondary | ICD-10-CM

## 2017-05-29 DIAGNOSIS — N941 Unspecified dyspareunia: Secondary | ICD-10-CM | POA: Diagnosis not present

## 2017-05-29 DIAGNOSIS — N952 Postmenopausal atrophic vaginitis: Secondary | ICD-10-CM

## 2017-05-29 DIAGNOSIS — Z1231 Encounter for screening mammogram for malignant neoplasm of breast: Secondary | ICD-10-CM

## 2017-05-29 DIAGNOSIS — Z9889 Other specified postprocedural states: Secondary | ICD-10-CM

## 2017-05-29 DIAGNOSIS — Z1239 Encounter for other screening for malignant neoplasm of breast: Secondary | ICD-10-CM

## 2017-05-29 DIAGNOSIS — Z01419 Encounter for gynecological examination (general) (routine) without abnormal findings: Secondary | ICD-10-CM | POA: Diagnosis not present

## 2017-05-29 DIAGNOSIS — Z1211 Encounter for screening for malignant neoplasm of colon: Secondary | ICD-10-CM | POA: Diagnosis not present

## 2017-05-29 MED ORDER — NORETHINDRONE-ETH ESTRADIOL 0.5-2.5 MG-MCG PO TABS: 1.0000 | ORAL_TABLET | Freq: Every day | ORAL | 3 refills | Status: DC

## 2017-05-29 NOTE — Patient Instructions (Signed)
1.  No Pap smear is done 2.  Mammogram is ordered 3.  Stool guaiac cards for colon cancer screening normal given. 4.  Screening labs are ordered 5.  Hormone replacement therapy is refilled for 1 year 6.  Over-the-counter lubricants include:  Virgin olive oil  Astroglide  Jo H2O lubricant 7.  Return in 1 year for annual exam   Health Maintenance for Postmenopausal Women Menopause is a normal process in which your reproductive ability comes to an end. This process happens gradually over a span of months to years, usually between the ages of 73 and 54. Menopause is complete when you have missed 12 consecutive menstrual periods. It is important to talk with your health care provider about some of the most common conditions that affect postmenopausal women, such as heart disease, cancer, and bone loss (osteoporosis). Adopting a healthy lifestyle and getting preventive care can help to promote your health and wellness. Those actions can also lower your chances of developing some of these common conditions. What should I know about menopause? During menopause, you may experience a number of symptoms, such as:  Moderate-to-severe hot flashes.  Night sweats.  Decrease in sex drive.  Mood swings.  Headaches.  Tiredness.  Irritability.  Memory problems.  Insomnia.  Choosing to treat or not to treat menopausal changes is an individual decision that you make with your health care provider. What should I know about hormone replacement therapy and supplements? Hormone therapy products are effective for treating symptoms that are associated with menopause, such as hot flashes and night sweats. Hormone replacement carries certain risks, especially as you become older. If you are thinking about using estrogen or estrogen with progestin treatments, discuss the benefits and risks with your health care provider. What should I know about heart disease and stroke? Heart disease, heart attack, and  stroke become more likely as you age. This may be due, in part, to the hormonal changes that your body experiences during menopause. These can affect how your body processes dietary fats, triglycerides, and cholesterol. Heart attack and stroke are both medical emergencies. There are many things that you can do to help prevent heart disease and stroke:  Have your blood pressure checked at least every 1-2 years. High blood pressure causes heart disease and increases the risk of stroke.  If you are 61-24 years old, ask your health care provider if you should take aspirin to prevent a heart attack or a stroke.  Do not use any tobacco products, including cigarettes, chewing tobacco, or electronic cigarettes. If you need help quitting, ask your health care provider.  It is important to eat a healthy diet and maintain a healthy weight. ? Be sure to include plenty of vegetables, fruits, low-fat dairy products, and lean protein. ? Avoid eating foods that are high in solid fats, added sugars, or salt (sodium).  Get regular exercise. This is one of the most important things that you can do for your health. ? Try to exercise for at least 150 minutes each week. The type of exercise that you do should increase your heart rate and make you sweat. This is known as moderate-intensity exercise. ? Try to do strengthening exercises at least twice each week. Do these in addition to the moderate-intensity exercise.  Know your numbers.Ask your health care provider to check your cholesterol and your blood glucose. Continue to have your blood tested as directed by your health care provider.  What should I know about cancer screening? There are  several types of cancer. Take the following steps to reduce your risk and to catch any cancer development as early as possible. Breast Cancer  Practice breast self-awareness. ? This means understanding how your breasts normally appear and feel. ? It also means doing regular  breast self-exams. Let your health care provider know about any changes, no matter how small.  If you are 49 or older, have a clinician do a breast exam (clinical breast exam or CBE) every year. Depending on your age, family history, and medical history, it may be recommended that you also have a yearly breast X-ray (mammogram).  If you have a family history of breast cancer, talk with your health care provider about genetic screening.  If you are at high risk for breast cancer, talk with your health care provider about having an MRI and a mammogram every year.  Breast cancer (BRCA) gene test is recommended for women who have family members with BRCA-related cancers. Results of the assessment will determine the need for genetic counseling and BRCA1 and for BRCA2 testing. BRCA-related cancers include these types: ? Breast. This occurs in males or females. ? Ovarian. ? Tubal. This may also be called fallopian tube cancer. ? Cancer of the abdominal or pelvic lining (peritoneal cancer). ? Prostate. ? Pancreatic.  Cervical, Uterine, and Ovarian Cancer Your health care provider may recommend that you be screened regularly for cancer of the pelvic organs. These include your ovaries, uterus, and vagina. This screening involves a pelvic exam, which includes checking for microscopic changes to the surface of your cervix (Pap test).  For women ages 21-65, health care providers may recommend a pelvic exam and a Pap test every three years. For women ages 28-65, they may recommend the Pap test and pelvic exam, combined with testing for human papilloma virus (HPV), every five years. Some types of HPV increase your risk of cervical cancer. Testing for HPV may also be done on women of any age who have unclear Pap test results.  Other health care providers may not recommend any screening for nonpregnant women who are considered low risk for pelvic cancer and have no symptoms. Ask your health care provider if a  screening pelvic exam is right for you.  If you have had past treatment for cervical cancer or a condition that could lead to cancer, you need Pap tests and screening for cancer for at least 20 years after your treatment. If Pap tests have been discontinued for you, your risk factors (such as having a new sexual partner) need to be reassessed to determine if you should start having screenings again. Some women have medical problems that increase the chance of getting cervical cancer. In these cases, your health care provider may recommend that you have screening and Pap tests more often.  If you have a family history of uterine cancer or ovarian cancer, talk with your health care provider about genetic screening.  If you have vaginal bleeding after reaching menopause, tell your health care provider.  There are currently no reliable tests available to screen for ovarian cancer.  Lung Cancer Lung cancer screening is recommended for adults 53-31 years old who are at high risk for lung cancer because of a history of smoking. A yearly low-dose CT scan of the lungs is recommended if you:  Currently smoke.  Have a history of at least 30 pack-years of smoking and you currently smoke or have quit within the past 15 years. A pack-year is smoking an average of  one pack of cigarettes per day for one year.  Yearly screening should:  Continue until it has been 15 years since you quit.  Stop if you develop a health problem that would prevent you from having lung cancer treatment.  Colorectal Cancer  This type of cancer can be detected and can often be prevented.  Routine colorectal cancer screening usually begins at age 42 and continues through age 65.  If you have risk factors for colon cancer, your health care provider may recommend that you be screened at an earlier age.  If you have a family history of colorectal cancer, talk with your health care provider about genetic screening.  Your health  care provider may also recommend using home test kits to check for hidden blood in your stool.  A small camera at the end of a tube can be used to examine your colon directly (sigmoidoscopy or colonoscopy). This is done to check for the earliest forms of colorectal cancer.  Direct examination of the colon should be repeated every 5-10 years until age 40. However, if early forms of precancerous polyps or small growths are found or if you have a family history or genetic risk for colorectal cancer, you may need to be screened more often.  Skin Cancer  Check your skin from head to toe regularly.  Monitor any moles. Be sure to tell your health care provider: ? About any new moles or changes in moles, especially if there is a change in a mole's shape or color. ? If you have a mole that is larger than the size of a pencil eraser.  If any of your family members has a history of skin cancer, especially at a young age, talk with your health care provider about genetic screening.  Always use sunscreen. Apply sunscreen liberally and repeatedly throughout the day.  Whenever you are outside, protect yourself by wearing long sleeves, pants, a wide-brimmed hat, and sunglasses.  What should I know about osteoporosis? Osteoporosis is a condition in which bone destruction happens more quickly than new bone creation. After menopause, you may be at an increased risk for osteoporosis. To help prevent osteoporosis or the bone fractures that can happen because of osteoporosis, the following is recommended:  If you are 53-66 years old, get at least 1,000 mg of calcium and at least 600 mg of vitamin D per day.  If you are older than age 33 but younger than age 55, get at least 1,200 mg of calcium and at least 600 mg of vitamin D per day.  If you are older than age 5, get at least 1,200 mg of calcium and at least 800 mg of vitamin D per day.  Smoking and excessive alcohol intake increase the risk of  osteoporosis. Eat foods that are rich in calcium and vitamin D, and do weight-bearing exercises several times each week as directed by your health care provider. What should I know about how menopause affects my mental health? Depression may occur at any age, but it is more common as you become older. Common symptoms of depression include:  Low or sad mood.  Changes in sleep patterns.  Changes in appetite or eating patterns.  Feeling an overall lack of motivation or enjoyment of activities that you previously enjoyed.  Frequent crying spells.  Talk with your health care provider if you think that you are experiencing depression. What should I know about immunizations? It is important that you get and maintain your immunizations. These include:  Tetanus, diphtheria, and pertussis (Tdap) booster vaccine.  Influenza every year before the flu season begins.  Pneumonia vaccine.  Shingles vaccine.  Your health care provider may also recommend other immunizations. This information is not intended to replace advice given to you by your health care provider. Make sure you discuss any questions you have with your health care provider. Document Released: 02/23/2005 Document Revised: 07/22/2015 Document Reviewed: 10/05/2014 Elsevier Interactive Patient Education  2018 [employer]  

## 2017-05-31 ENCOUNTER — Telehealth: Payer: Self-pay | Admitting: *Deleted

## 2017-05-31 NOTE — Telephone Encounter (Signed)
Patient called and states that she seen Dr. Tommi Rumps on the 15th and the nurse was suppose to check on a medication for her and call her back. The patient is requesting a cal back. Her contact number is [phone]. Please advise. Thank you

## 2017-05-31 NOTE — Telephone Encounter (Signed)
Pt aware per pharmacist jevantique lo is generic femhrt.

## 2017-06-18 DIAGNOSIS — J0140 Acute pansinusitis, unspecified: Secondary | ICD-10-CM | POA: Diagnosis not present

## 2017-06-24 DIAGNOSIS — D2261 Melanocytic nevi of right upper limb, including shoulder: Secondary | ICD-10-CM | POA: Diagnosis not present

## 2017-06-24 DIAGNOSIS — Z85828 Personal history of other malignant neoplasm of skin: Secondary | ICD-10-CM | POA: Diagnosis not present

## 2017-06-24 DIAGNOSIS — D2262 Melanocytic nevi of left upper limb, including shoulder: Secondary | ICD-10-CM | POA: Diagnosis not present

## 2017-06-24 DIAGNOSIS — D2272 Melanocytic nevi of left lower limb, including hip: Secondary | ICD-10-CM | POA: Diagnosis not present

## 2017-06-26 ENCOUNTER — Other Ambulatory Visit: Payer: Self-pay | Admitting: Obstetrics and Gynecology

## 2017-09-11 ENCOUNTER — Ambulatory Visit: Payer: BLUE CROSS/BLUE SHIELD | Admitting: Obstetrics and Gynecology

## 2017-09-11 ENCOUNTER — Encounter: Payer: Self-pay | Admitting: Obstetrics and Gynecology

## 2017-09-11 VITALS — BP 118/76 | HR 76 | Temp 98.7°F | Ht 66.0 in | Wt 129.6 lb

## 2017-09-11 DIAGNOSIS — R300 Dysuria: Secondary | ICD-10-CM | POA: Diagnosis not present

## 2017-09-11 DIAGNOSIS — N3091 Cystitis, unspecified with hematuria: Secondary | ICD-10-CM

## 2017-09-11 DIAGNOSIS — R350 Frequency of micturition: Secondary | ICD-10-CM

## 2017-09-11 LAB — POCT URINALYSIS DIPSTICK
Bilirubin, UA: NEGATIVE
Glucose, UA: NEGATIVE
KETONES UA: NEGATIVE
NITRITE UA: POSITIVE
Odor: NEGATIVE
PH UA: 6 (ref 5.0–8.0)
PROTEIN UA: POSITIVE — AB
SPEC GRAV UA: 1.02 (ref 1.010–1.025)
UROBILINOGEN UA: 0.2 U/dL

## 2017-09-11 MED ORDER — NITROFURANTOIN MONOHYD MACRO 100 MG PO CAPS: 100.0000 mg | ORAL_CAPSULE | Freq: Two times a day (BID) | ORAL | 0 refills | Status: DC

## 2017-09-11 NOTE — Patient Instructions (Signed)
1.  Increase water intake 2.  Increase cranberry juice intake or cranberry tabs 3.  Take Macrobid twice daily for 7 days 4.  Urine culture is sent to confirm UTI sensitivity   Urinary Tract Infection, Adult A urinary tract infection (UTI) is an infection of any part of the urinary tract, which includes the kidneys, ureters, bladder, and urethra. These organs make, store, and get rid of urine in the body. UTI can be a bladder infection (cystitis) or kidney infection (pyelonephritis). What are the causes? This infection may be caused by fungi, viruses, or bacteria. Bacteria are the most common cause of UTIs. This condition can also be caused by repeated incomplete emptying of the bladder during urination. What increases the risk? This condition is more likely to develop if:  You ignore your need to urinate or hold urine for long periods of time.  You do not empty your bladder completely during urination.  You wipe back to front after urinating or having a bowel movement, if you are female.  You are uncircumcised, if you are female.  You are constipated.  You have a urinary catheter that stays in place (indwelling).  You have a weak defense (immune) system.  You have a medical condition that affects your bowels, kidneys, or bladder.  You have diabetes.  You take antibiotic medicines frequently or for long periods of time, and the antibiotics no longer work well against certain types of infections (antibiotic resistance).  You take medicines that irritate your urinary tract.  You are exposed to chemicals that irritate your urinary tract.  You are female.  What are the signs or symptoms? Symptoms of this condition include:  Fever.  Frequent urination or passing small amounts of urine frequently.  Needing to urinate urgently.  Pain or burning with urination.  Urine that smells bad or unusual.  Cloudy urine.  Pain in the lower abdomen or back.  Trouble  urinating.  Blood in the urine.  Vomiting or being less hungry than normal.  Diarrhea or abdominal pain.  Vaginal discharge, if you are female.  How is this diagnosed? This condition is diagnosed with a medical history and physical exam. You will also need to provide a urine sample to test your urine. Other tests may be done, including:  Blood tests.  Sexually transmitted disease (STD) testing.  If you have had more than one UTI, a cystoscopy or imaging studies may be done to determine the cause of the infections. How is this treated? Treatment for this condition often includes a combination of two or more of the following:  Antibiotic medicine.  Other medicines to treat less common causes of UTI.  Over-the-counter medicines to treat pain.  Drinking enough water to stay hydrated.  Follow these instructions at home:  Take over-the-counter and prescription medicines only as told by your health care provider.  If you were prescribed an antibiotic, take it as told by your health care provider. Do not stop taking the antibiotic even if you start to feel better.  Avoid alcohol, caffeine, tea, and carbonated beverages. They can irritate your bladder.  Drink enough fluid to keep your urine clear or pale yellow.  Keep all follow-up visits as told by your health care provider. This is important.  Make sure to: ? Empty your bladder often and completely. Do not hold urine for long periods of time. ? Empty your bladder before and after sex. ? Wipe from front to back after a bowel movement if you are female.  Use each tissue one time when you wipe. Contact a health care provider if:  You have back pain.  You have a fever.  You feel nauseous or vomit.  Your symptoms do not get better after 3 days.  Your symptoms go away and then return. Get help right away if:  You have severe back pain or lower abdominal pain.  You are vomiting and cannot keep down any medicines or  water. This information is not intended to replace advice given to you by your health care provider. Make sure you discuss any questions you have with your health care provider. Document Released: 10/11/2004 Document Revised: 06/15/2015 Document Reviewed: 11/22/2014 Elsevier Interactive Patient Education  [employer].

## 2017-09-11 NOTE — Progress Notes (Signed)
Chief complaint: 1.  Urinary frequency 2.  Urinary urgency 3.  Blood in urine  Patient presents today for evaluation of possible UTI.  She has developed urinary frequency and urgency and today also noted blood in her urine.  She denies fevers chills or sweats.  She denies flank pain.  She denies bladder pain. Increase water intake has not helped her symptoms.  Past medical history, past surgical history, problem list, medications, and allergies are reviewed  Review of systems: Per HPI  OBJECTIVE: BP 118/76   Pulse 76   Temp 98.7 F (37.1 C)   Ht 5\' 6"  (1.676 m)   Wt 129 lb 9.6 oz (58.8 kg)   BMI 20.92 kg/m  Well-appearing female no acute distress.  Alert and oriented. Back: No CVA tenderness Abdomen: Soft, nontender Bladder: Nontender Pelvic: Not examined Extremities: Warm and dry   Urinalysis: Large blood; positive protein; positive nitrites; 2+ leukocytes  ASSESSMENT: 1.  Hemorrhagic cystitis  PLAN: 1.  Increase water intake 2.  Increase cranberry juice or cranberry tablet intake 3.  Macrobid twice daily for 7 days 4.  Urine culture to confirm sensitivity of UTI bacteria 5.  Return as needed.  Herold HarmsMartin A Markeisha Mancias, MD  Note: This dictation was prepared with Dragon dictation along with smaller phrase technology. Any transcriptional errors that result from this process are unintentional.

## 2017-09-13 LAB — URINE CULTURE

## 2017-09-14 ENCOUNTER — Other Ambulatory Visit: Payer: Self-pay | Admitting: Obstetrics and Gynecology

## 2017-12-11 ENCOUNTER — Other Ambulatory Visit: Payer: Self-pay | Admitting: Obstetrics and Gynecology

## 2018-01-17 DIAGNOSIS — M5412 Radiculopathy, cervical region: Secondary | ICD-10-CM | POA: Diagnosis not present

## 2018-01-17 DIAGNOSIS — M4722 Other spondylosis with radiculopathy, cervical region: Secondary | ICD-10-CM | POA: Diagnosis not present

## 2018-02-10 DIAGNOSIS — M6281 Muscle weakness (generalized): Secondary | ICD-10-CM | POA: Diagnosis not present

## 2018-02-19 DIAGNOSIS — M6281 Muscle weakness (generalized): Secondary | ICD-10-CM | POA: Diagnosis not present

## 2018-02-25 ENCOUNTER — Ambulatory Visit: Payer: Self-pay | Admitting: Podiatry

## 2018-02-26 DIAGNOSIS — M6281 Muscle weakness (generalized): Secondary | ICD-10-CM | POA: Diagnosis not present

## 2018-03-05 DIAGNOSIS — M6281 Muscle weakness (generalized): Secondary | ICD-10-CM | POA: Diagnosis not present

## 2018-03-12 DIAGNOSIS — M6281 Muscle weakness (generalized): Secondary | ICD-10-CM | POA: Diagnosis not present

## 2018-03-19 DIAGNOSIS — M6281 Muscle weakness (generalized): Secondary | ICD-10-CM | POA: Diagnosis not present

## 2018-03-26 DIAGNOSIS — M6281 Muscle weakness (generalized): Secondary | ICD-10-CM | POA: Diagnosis not present

## 2018-05-13 ENCOUNTER — Other Ambulatory Visit: Payer: Self-pay | Admitting: *Deleted

## 2018-05-13 MED ORDER — NORETHINDRONE-ETH ESTRADIOL 0.5-2.5 MG-MCG PO TABS: 1.0000 | ORAL_TABLET | Freq: Every day | ORAL | 3 refills | Status: DC

## 2018-06-03 ENCOUNTER — Encounter: Payer: BLUE CROSS/BLUE SHIELD | Admitting: Obstetrics and Gynecology

## 2018-06-06 ENCOUNTER — Encounter: Payer: BLUE CROSS/BLUE SHIELD | Admitting: Obstetrics and Gynecology

## 2018-08-19 ENCOUNTER — Encounter: Payer: Self-pay | Admitting: Obstetrics and Gynecology

## 2018-08-19 ENCOUNTER — Other Ambulatory Visit: Payer: Self-pay

## 2018-08-19 ENCOUNTER — Ambulatory Visit (INDEPENDENT_AMBULATORY_CARE_PROVIDER_SITE_OTHER): Payer: BC Managed Care – PPO | Admitting: Obstetrics and Gynecology

## 2018-08-19 VITALS — BP 108/80 | HR 78 | Ht 65.0 in | Wt 132.6 lb

## 2018-08-19 DIAGNOSIS — R5383 Other fatigue: Secondary | ICD-10-CM

## 2018-08-19 DIAGNOSIS — N952 Postmenopausal atrophic vaginitis: Secondary | ICD-10-CM | POA: Diagnosis not present

## 2018-08-19 DIAGNOSIS — Z01419 Encounter for gynecological examination (general) (routine) without abnormal findings: Secondary | ICD-10-CM

## 2018-08-19 DIAGNOSIS — G4700 Insomnia, unspecified: Secondary | ICD-10-CM | POA: Diagnosis not present

## 2018-08-19 MED ORDER — ESTROGENS, CONJUGATED 0.625 MG/GM VA CREA: 0.2500 | TOPICAL_CREAM | VAGINAL | 3 refills | Status: DC

## 2018-08-19 MED ORDER — PROGESTERONE MICRONIZED 200 MG PO CAPS: 200.0000 mg | ORAL_CAPSULE | Freq: Every day | ORAL | 4 refills | Status: DC

## 2018-08-19 NOTE — Patient Instructions (Signed)
Sleep now by Herbalife, one tablet about an hour before bedtime

## 2018-08-19 NOTE — Progress Notes (Signed)
SUBJECTIVE:  58 y.o. caucasian female for annual routine checkup. No LMP recorded, postmenopausal. Last pap 05/15/2016, neg/neg. Last mam Bi-rads 1-12/2016. She stays home with 5yo granddaughter. Married, living with family.  Pt has concerns for the following:  Vaginal dryness: vaginal dryness has been ongoing since menopause, but pain with vaginal penetration has become "unbearable". She reports not using Premarin consistently until 6 weeks ago and has not noticed a difference. Advised consistent use for at least 3 months to see difference in vaginal dryness and atrophy.  She reports intense HA, lasting for 3 days at a time, light sensitive, and  Nausea. She tried tylenol and ibuprofen without pain relief. HA focused on right frontal with radiating pain to right ear. Pt stopped taking Jevantique Lo and headaches reduced in frequency, duration, and intensity. Able to take goodypack and HA will dissipate.   Insomnia: pt reports having tried a combination of melatonin, benadryl, cut out caffeine past the afternoon,and  exercise after dinner. Still has difficulty falling asleep. Denies worry or anxiety.   Rash around rectum: pt reports intermittent urgency of diarrhea that is throughout the week that produces a rash on rectum that is local to rectum only, dissipates with use of goldbond powder.   Current Outpatient Medications  Medication Sig Dispense Refill  . conjugated estrogens (PREMARIN) vaginal cream Place 4.88 Applicatorfuls vaginally 2 (two) times a week. 42.5 g 3  . Calcium-Vitamin D-Vitamin K (VIACTIV PO) Take by mouth daily.    Marland Kitchen glucosamine-chondroitin 500-400 MG tablet Take 1 tablet by mouth 3 (three) times daily.    Marland Kitchen JEVANTIQUE LO 0.5-2.5 MG-MCG tablet TAKE 1 TABLET BY MOUTH DAILY (Patient not taking: Reported on 08/19/2018) 84 tablet 1  . nitrofurantoin, macrocrystal-monohydrate, (MACROBID) 100 MG capsule Take 1 capsule (100 mg total) by mouth 2 (two) times daily. (Patient not taking:  Reported on 08/19/2018) 14 capsule 0  . norethindrone-ethinyl estradiol (JEVANTIQUE LO) 0.5-2.5 MG-MCG tablet Take 1 tablet by mouth daily. (Patient not taking: Reported on 08/19/2018) 84 tablet 3   No current facility-administered medications for this visit.    Allergies: Patient has no known allergies.  No LMP recorded. Patient is postmenopausal.  ROS:  Feeling well. No dyspnea or chest pain on exertion.  No abdominal pain, change in bowel habits, black or bloody stools.  No urinary tract symptoms. GYN ROS: no menses - postmenopausal, no abnormal bleeding, pelvic pain or discharge, no breast pain or new or enlarging lumps on self exam. No neurological complaints.  OBJECTIVE:  The patient appears well, alert, oriented x 3, in no distress. BP 108/80   Pulse 78   Ht 5\' 5"  (1.651 m)   Wt 60.1 kg   BMI 22.07 kg/m  ENT normal.  Neck supple. No adenopathy or thyromegaly. PERLA. Lungs are clear, good air entry, no wheezes, rhonchi or rales. S1 and S2 normal, no murmurs, regular rate and rhythm. Abdomen soft without tenderness, guarding, mass or organomegaly. Extremities show no edema, normal peripheral pulses. Neurological is normal, no focal findings.  BREAST EXAM: breasts appear normal, no suspicious masses, no skin or nipple changes or axillary nodes. Breast implants in place.   PELVIC EXAM: normal external genitalia, vulva, vagina, cervix, uterus and adnexa. Erythema noted locally around rectum. Denies pain with palpation or itching.   ASSESSMENT:  Well woman Fatigue Insomnia Rash around rectum Vaginal atrophy  PLAN:  Refill Premarin vaginal cream RX Prometrium 200mg  RX ordered Labs ordered Mammogram ordered Bourdaux butt paste advised for rectal rash Herbalife, sleep  now, herbal supplement advised for insomnia Vit D, ferritin, cbc, B12 and folate panel to assess for deficiency r/t fatigue Return in 1 yr for AE or sooner if needed  Maisie FusBethann Teyanna Thielman, SNM

## 2018-08-20 LAB — COMPREHENSIVE METABOLIC PANEL WITH GFR
ALT: 20 [IU]/L (ref 0–32)
AST: 24 [IU]/L (ref 0–40)
Albumin/Globulin Ratio: 1.8 (ref 1.2–2.2)
Albumin: 4.6 g/dL (ref 3.8–4.9)
Alkaline Phosphatase: 89 [IU]/L (ref 39–117)
BUN/Creatinine Ratio: 19 (ref 9–23)
BUN: 15 mg/dL (ref 6–24)
Bilirubin Total: 0.4 mg/dL (ref 0.0–1.2)
CO2: 26 mmol/L (ref 20–29)
Calcium: 9.5 mg/dL (ref 8.7–10.2)
Chloride: 100 mmol/L (ref 96–106)
Creatinine, Ser: 0.81 mg/dL (ref 0.57–1.00)
GFR calc Af Amer: 94 mL/min/{1.73_m2}
GFR calc non Af Amer: 81 mL/min/{1.73_m2}
Globulin, Total: 2.6 g/dL (ref 1.5–4.5)
Glucose: 87 mg/dL (ref 65–99)
Potassium: 4.3 mmol/L (ref 3.5–5.2)
Sodium: 140 mmol/L (ref 134–144)
Total Protein: 7.2 g/dL (ref 6.0–8.5)

## 2018-08-20 LAB — LIPID PANEL
Chol/HDL Ratio: 3.2 ratio (ref 0.0–4.4)
Cholesterol, Total: 202 mg/dL — ABNORMAL HIGH (ref 100–199)
HDL: 64 mg/dL
LDL Calculated: 123 mg/dL — ABNORMAL HIGH (ref 0–99)
Triglycerides: 75 mg/dL (ref 0–149)
VLDL Cholesterol Cal: 15 mg/dL (ref 5–40)

## 2018-08-20 LAB — CBC
Hematocrit: 42 % (ref 34.0–46.6)
Hemoglobin: 14.2 g/dL (ref 11.1–15.9)
MCH: 32 pg (ref 26.6–33.0)
MCHC: 33.8 g/dL (ref 31.5–35.7)
MCV: 95 fL (ref 79–97)
Platelets: 156 10*3/uL (ref 150–450)
RBC: 4.44 x10E6/uL (ref 3.77–5.28)
RDW: 11.5 % — ABNORMAL LOW (ref 11.7–15.4)
WBC: 4.4 10*3/uL (ref 3.4–10.8)

## 2018-08-20 LAB — B12 AND FOLATE PANEL
Folate: 10 ng/mL (ref >3.0)
Vitamin B-12: 331 pg/mL (ref 232–1245)

## 2018-08-20 LAB — VITAMIN D 25 HYDROXY (VIT D DEFICIENCY, FRACTURES): Vit D, 25-Hydroxy: 35.3 ng/mL (ref 30.0–100.0)

## 2018-08-20 LAB — HEMOGLOBIN A1C
Est. average glucose Bld gHb Est-mCnc: 103 mg/dL
Hgb A1c MFr Bld: 5.2 % (ref 4.8–5.6)

## 2018-08-20 LAB — FERRITIN: Ferritin: 63 ng/mL (ref 15–150)

## 2018-08-20 LAB — TSH: TSH: 0.263 u[IU]/mL — ABNORMAL LOW (ref 0.450–4.500)

## 2018-09-01 ENCOUNTER — Telehealth: Payer: Self-pay | Admitting: Obstetrics and Gynecology

## 2018-09-01 NOTE — Telephone Encounter (Signed)
Patient called requesting lab results. Thanks.

## 2018-09-02 NOTE — Telephone Encounter (Signed)
Notified pt of results 

## 2018-09-24 ENCOUNTER — Ambulatory Visit
Admission: RE | Admit: 2018-09-24 | Discharge: 2018-09-24 | Disposition: A | Discharge status: Home / Self Care | Payer: BC Managed Care – PPO | Source: Ambulatory Visit | Attending: Obstetrics and Gynecology | Admitting: Obstetrics and Gynecology

## 2018-09-24 DIAGNOSIS — Z01419 Encounter for gynecological examination (general) (routine) without abnormal findings: Secondary | ICD-10-CM

## 2018-09-24 DIAGNOSIS — Z1231 Encounter for screening mammogram for malignant neoplasm of breast: Secondary | ICD-10-CM | POA: Insufficient documentation

## 2018-09-26 ENCOUNTER — Other Ambulatory Visit: Payer: BC Managed Care – PPO

## 2018-09-26 ENCOUNTER — Other Ambulatory Visit: Payer: Self-pay

## 2018-09-26 DIAGNOSIS — R5383 Other fatigue: Secondary | ICD-10-CM

## 2018-09-27 LAB — THYROID PANEL WITH TSH
Free Thyroxine Index: 1.7 (ref 1.2–4.9)
T3 Uptake Ratio: 28 % (ref 24–39)
T4, Total: 6 ug/dL (ref 4.5–12.0)
TSH: 0.369 u[IU]/mL — ABNORMAL LOW (ref 0.450–4.500)

## 2018-11-26 DIAGNOSIS — L538 Other specified erythematous conditions: Secondary | ICD-10-CM | POA: Diagnosis not present

## 2018-11-26 DIAGNOSIS — L820 Inflamed seborrheic keratosis: Secondary | ICD-10-CM | POA: Diagnosis not present

## 2018-11-26 DIAGNOSIS — Z85828 Personal history of other malignant neoplasm of skin: Secondary | ICD-10-CM | POA: Diagnosis not present

## 2018-11-26 DIAGNOSIS — L298 Other pruritus: Secondary | ICD-10-CM | POA: Diagnosis not present

## 2018-11-26 DIAGNOSIS — D2272 Melanocytic nevi of left lower limb, including hip: Secondary | ICD-10-CM | POA: Diagnosis not present

## 2018-11-26 DIAGNOSIS — D2262 Melanocytic nevi of left upper limb, including shoulder: Secondary | ICD-10-CM | POA: Diagnosis not present

## 2018-11-26 DIAGNOSIS — D2261 Melanocytic nevi of right upper limb, including shoulder: Secondary | ICD-10-CM | POA: Diagnosis not present

## 2018-12-12 ENCOUNTER — Ambulatory Visit: Payer: Self-pay | Admitting: *Deleted

## 2018-12-12 NOTE — Telephone Encounter (Signed)
Summary: big toe swollen and red- no injury    Patient calling with complaints of her big toe being swollen, painful and red. She states she has not injured it, there is no wound. She states it started yesterday.       Returned call to patient regarding her left great toe red, swollen and painful.  She does not feel feverish but very warm to touch. It started at the bottom of he toe nail.  The area is very tender to touch. More painful last night, took Tylenol, ice and finally calmed down. She is advised to go to an urgent care. She voiced understanding. To take NSAID, use ice and elevate her foot. She also is requesting to have an appointment for Monday. Appointment scheduled. Routing to [facility] for review.   Reason for Disposition . [1] SEVERE pain (e.g., excruciating, unable to do any normal activities) AND [2] not improved after 2 hours of pain medicine  Answer Assessment - Initial Assessment Questions 1. ONSET: "When did the pain start?"      yesterday 2. LOCATION: "Where is the pain located?"   (e.g., around nail, entire toe, at foot joint)      Left great toe 3. PAIN: "How bad is the pain?"    (Scale 1-10; or mild, moderate, severe)   -  MILD (1-3): doesn't interfere with normal activities    -  MODERATE (4-7): interferes with normal activities (e.g., work or school) or awakens from sleep, limping    -  SEVERE (8-10): excruciating pain, unable to do any normal activities, unable to walk     Was a #8/9 last night and a #5 today 4. APPEARANCE: "What does the toe look like?" (e.g., redness, swelling, bruising, pallor)     Red,swelling 5. CAUSE: "What do you think is causing the toe pain?"     Not sure 6. OTHER SYMPTOMS: "Do you have any other symptoms?" (e.g., leg pain, rash, fever, numbness)     no 7. PREGNANCY: "Is there any chance you are pregnant?" "When was your last menstrual period?"     n/a  Protocols used: TOE PAIN-A-AH

## 2018-12-13 IMAGING — MG MM  DIGITAL DIAGNOSTIC BREAST BILAT IMPLANT W/ TOMO W/ CAD
8 of 13 series · 8 of 29 positions shown · non-contrast
Comparison: Previous exam(s).

CLINICAL DATA: 56-year-old female for further evaluation of
possible right breast mass and left breast asymmetry on screening
mammogram.

EXAM:
2D DIGITAL DIAGNOSTIC BILATERAL MAMMOGRAM WITH IMPLANTS, CAD AND
ADJUNCT TOMO
ULTRASOUND BILATERAL BREAST
The patient has retropectoral implants. Standard and implant
displaced views were performed.

[R MLO (1 of 3)]
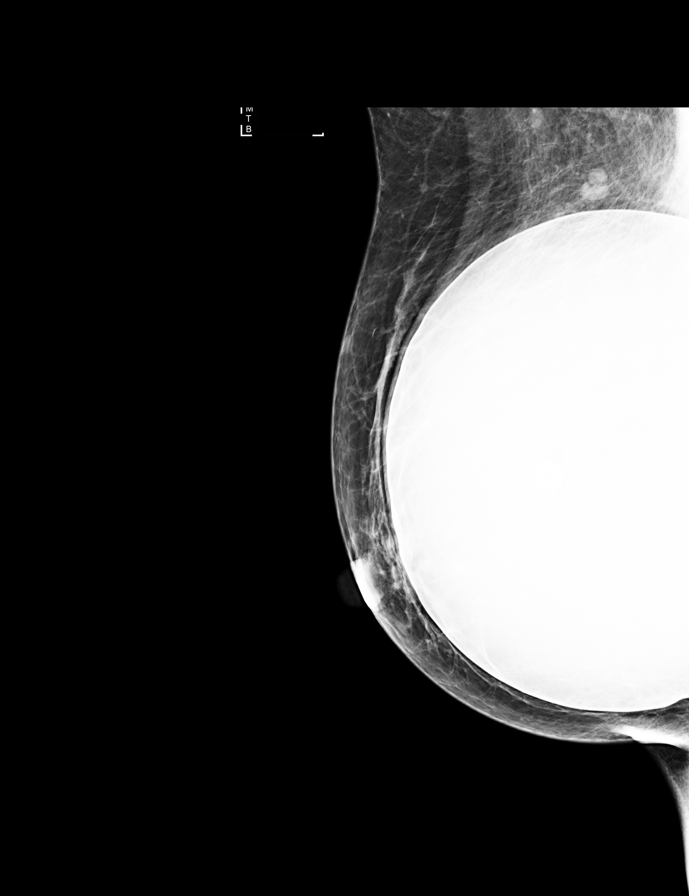

[L CC]
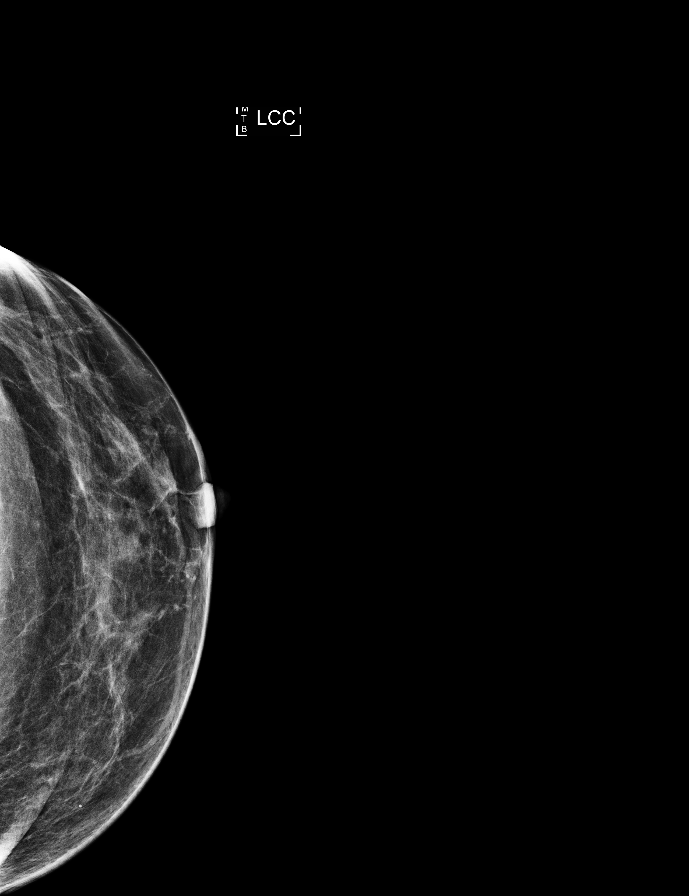

[R MLO (2 of 3)]
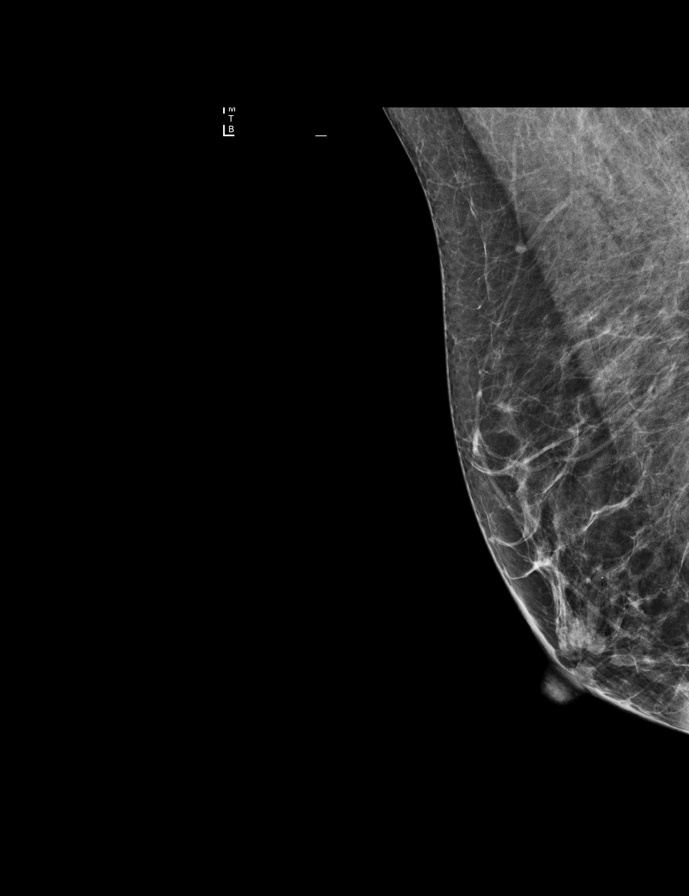

[L CC synth-2D]
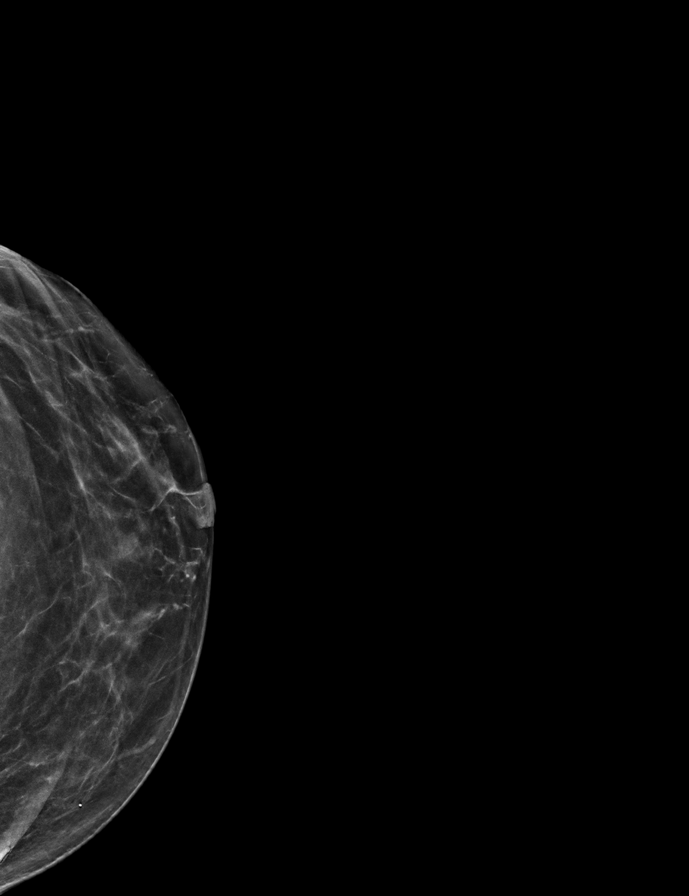

[R MLO (3 of 3)]
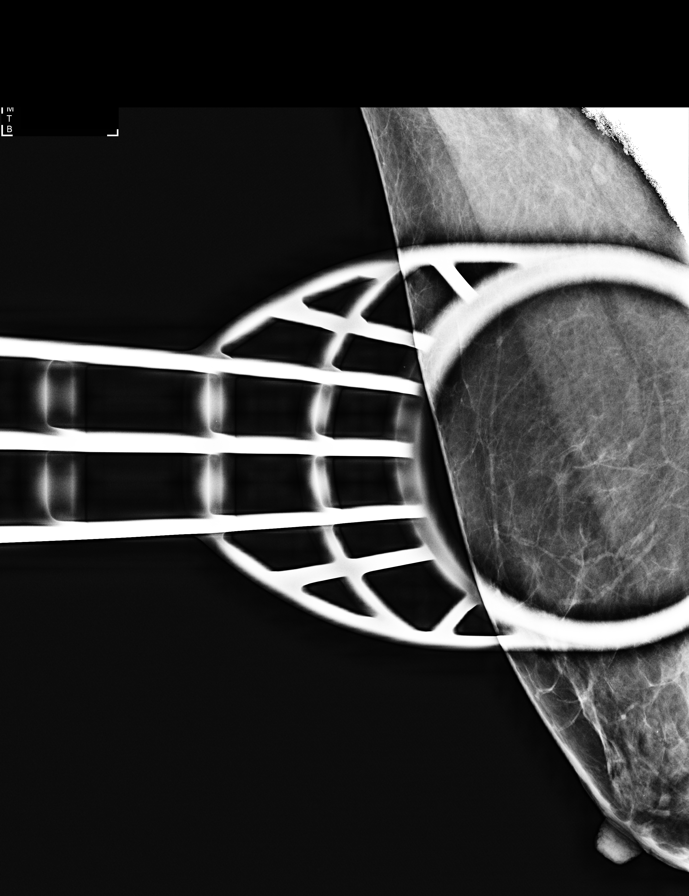

[L MLO]
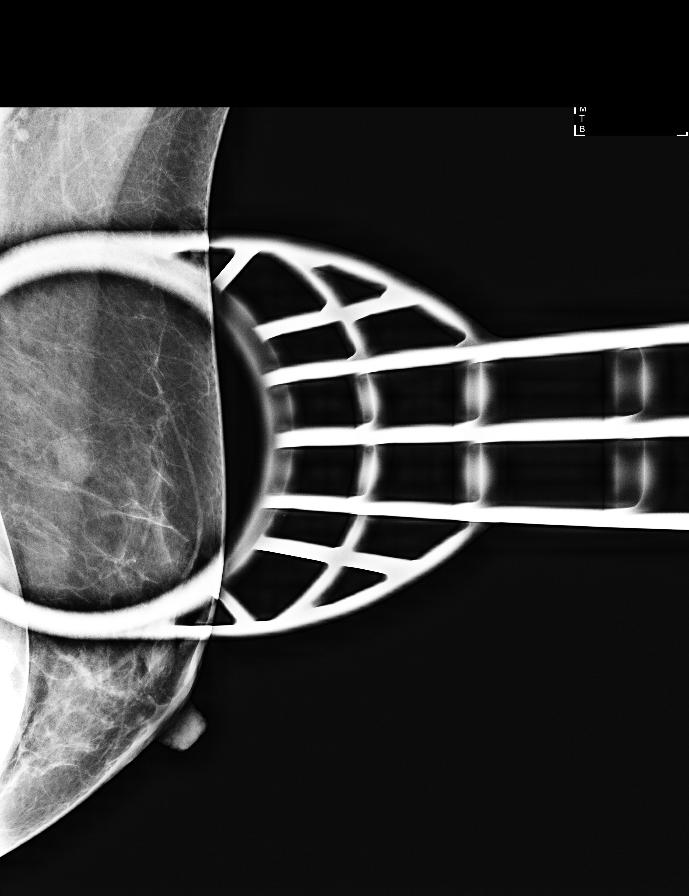

[L MLO synth-2D]
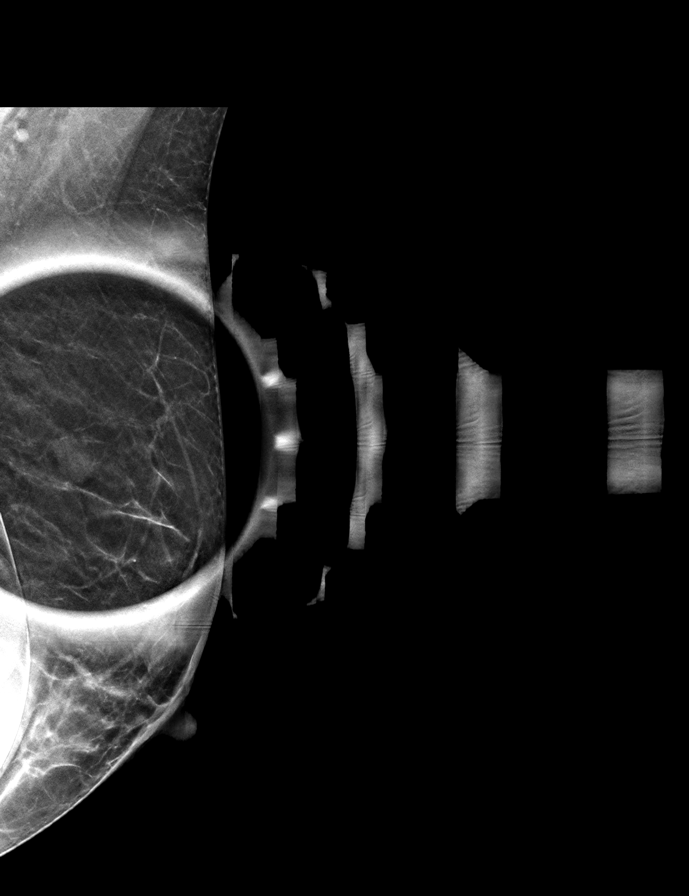

[R MLO synth-2D]
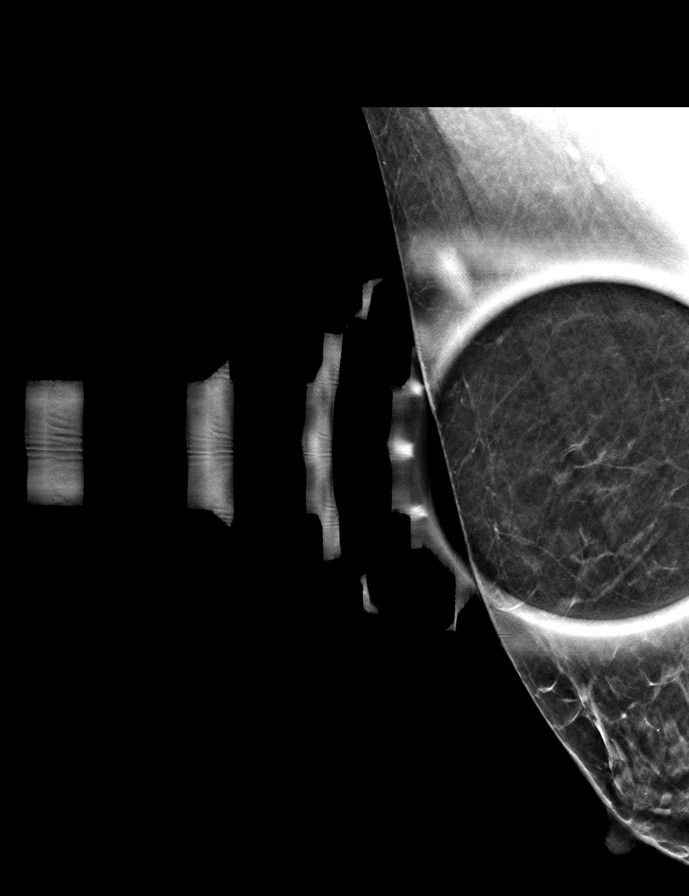

[8 of 29 positions shown; findings below may reference images not displayed]

ACR Breast Density Category b: There are scattered areas of
fibroglandular density.
FINDINGS: 2D and 3D full field and spot compression views of the breasts
performed.

A persistent circumscribed oval mass within the far upper-outer
right breast identified, with possible fatty hilum.

A faint persistent asymmetry within the posterior upper left breast
on the MLO view is identified, but on today's images has an
unchanged appearance from remote studies dating back to 5113.

Mammographic images were processed with CAD.

Targeted ultrasound is performed, showing a normal benign lymph node
at the 10 o'clock position of the right breast 12 cm from the nipple
measuring 9 mm in greatest diameter, corresponding to the screening
study finding.

No suspicious mass, distortion or abnormal shadowing is identified
within the upper left breast.
IMPRESSION: 1. Benign lymph node in the far upper-outer right breast
corresponding to the right breast screening study finding.
2. Posterior upper left breast asymmetry without sonographic
correlate and which on today's study has a stable appearance since
remote mammogram -benign.

RECOMMENDATION:
Bilateral screening mammograms in 1 year.

I have discussed the findings and recommendations with the patient.
Results were also provided in writing at the conclusion of the
visit. If applicable, a reminder letter will be sent to the patient
regarding the next appointment.

BI-RADS CATEGORY  2: Benign.

## 2018-12-15 ENCOUNTER — Encounter: Payer: Self-pay | Admitting: Family Medicine

## 2018-12-15 ENCOUNTER — Other Ambulatory Visit: Payer: Self-pay

## 2018-12-15 ENCOUNTER — Ambulatory Visit: Payer: BC Managed Care – PPO | Admitting: Family Medicine

## 2018-12-15 VITALS — BP 122/81 | HR 65 | Temp 97.6°F | Resp 16 | Wt 139.0 lb

## 2018-12-15 DIAGNOSIS — Z23 Encounter for immunization: Secondary | ICD-10-CM | POA: Diagnosis not present

## 2018-12-15 DIAGNOSIS — M10072 Idiopathic gout, left ankle and foot: Secondary | ICD-10-CM | POA: Diagnosis not present

## 2018-12-15 MED ORDER — COLCHICINE 0.6 MG PO TABS: ORAL_TABLET | ORAL | 0 refills | Status: DC

## 2018-12-15 NOTE — Progress Notes (Signed)
Patient: Kristin Little Female    DOB: 07-Dec-1960   58 y.o.   MRN: [id number] Visit Date: 12/15/2018  Today's Provider: Shirlee Latch, MD   Chief Complaint  Patient presents with  . Toe Pain   Subjective:     HPI Patient here today C/O left big toe pain, redness and swelling x's 5 days. Patient reports symptoms are better today. Patient denies any injuries, patient reports taking Tylenol, putting ice and elevation.   This has happened previously, but was more mild at that point.  No history of kidney disease, rheumatologic arthritis  No Known Allergies   Current Outpatient Medications:  .  Calcium-Vitamin D-Vitamin K (VIACTIV PO), Take by mouth daily., Disp: , Rfl:  .  conjugated estrogens (PREMARIN) vaginal cream, Place 0.25 Applicatorfuls vaginally 2 (two) times a week., Disp: 42.5 g, Rfl: 3 .  progesterone (PROMETRIUM) 200 MG capsule, Take 200 mg by mouth daily., Disp: , Rfl:   Review of Systems  Constitutional: Negative.   Respiratory: Negative.   Cardiovascular: Negative.   Musculoskeletal: Positive for arthralgias and joint swelling. Negative for back pain, gait problem and myalgias.  Skin: Positive for color change. Negative for rash and wound.  Neurological: Negative.   Psychiatric/Behavioral: Negative.     Social History   Tobacco Use  . Smoking status: Never Smoker  . Smokeless tobacco: Never Used  Substance Use Topics  . Alcohol use: Yes    Comment: occas - 4 drinks/mo      Objective:   BP 122/81 (BP Location: Left Arm, Patient Position: Sitting, Cuff Size: Normal)   Pulse 65   Temp 97.6 F (36.4 C) (Temporal)   Resp 16   Wt 139 lb (63 kg)   BMI 23.13 kg/m  Vitals:   12/15/18 1456  BP: 122/81  Pulse: 65  Resp: 16  Temp: 97.6 F (36.4 C)  TempSrc: Temporal  Weight: 139 lb (63 kg)  Body mass index is 23.13 kg/m.   Physical Exam Vitals signs reviewed.  Constitutional:      General: She is not in acute distress.  Appearance: Normal appearance. She is well-developed. She is not diaphoretic.  HENT:     Head: Normocephalic and atraumatic.  Eyes:     General: No scleral icterus.    Conjunctiva/sclera: Conjunctivae normal.  Cardiovascular:     Rate and Rhythm: Normal rate and regular rhythm.  Pulmonary:     Effort: Pulmonary effort is normal. No respiratory distress.  Musculoskeletal:     Comments: Mild redness and swelling of the left great toe IP joint with tenderness to palpation.  Range of motion is intact.  No overlying rash.  Skin:    General: Skin is warm and dry.     Capillary Refill: Capillary refill takes less than 2 seconds.     Findings: No rash.  Neurological:     Mental Status: She is alert and oriented to person, place, and time. Mental status is at baseline.     Sensory: No sensory deficit.     Motor: No weakness.  Psychiatric:        Mood and Affect: Mood normal.        Behavior: Behavior normal.      No results found for any visits on 12/15/18.     Assessment & Plan   1. Acute idiopathic gout involving toe of left foot -New problem -Now resolving with NSAID use and ice, but suspect gout flare -Discussed dietary changes to  help prevent future flares -She is on no medications and has no risk factors for gout otherwise -We will not check uric acid currently as it seems to be resolving -No need for imaging at this time -Rx for colchicine to take in case of future flare, but discussed that she should call our office if she feels like she needs to start this to see if we need to evaluate her again -If she has further flares, would consider uric acid lowering medication for prevention -Discussed return precautions  2. Need for influenza vaccination - Flu Vaccine QUAD 36+ mos IM   Meds ordered this encounter  Medications  . colchicine 0.6 MG tablet    Sig: Take 1.2mg  PO once at first sign of gout flare, then 0.6mg  PO 1 hour later, then continue 0.6mg  BID for 6 more days     Dispense:  15 tablet    Refill:  0     Return if symptoms worsen or fail to improve.   The entirety of the information documented in the History of Present Illness, Review of Systems and Physical Exam were personally obtained by me. Portions of this information were initially documented by Lynford Humphrey, CMA and reviewed by me for thoroughness and accuracy.    Shiva Sahagian, Dionne Bucy, MD MPH [facility]

## 2018-12-15 NOTE — Patient Instructions (Signed)
Low-Purine Eating Plan A low-purine eating plan involves making food choices to limit your intake of purine. Purine is a kind of uric acid. Too much uric acid in your blood can cause certain conditions, such as gout and kidney stones. Eating a low-purine diet can help control these conditions. What are tips for following this plan? Reading food labels   Avoid foods with saturated or Trans fat.  Check the ingredient list of grains-based foods, such as bread and cereal, to make sure that they contain whole grains.  Check the ingredient list of sauces or soups to make sure they do not contain meat or fish.  When choosing soft drinks, check the ingredient list to make sure they do not contain high-fructose corn syrup. Shopping  Buy plenty of fresh fruits and vegetables.  Avoid buying canned or fresh fish.  Buy dairy products labeled as low-fat or nonfat.  Avoid buying premade or processed foods. These foods are often high in fat, salt (sodium), and added sugar. Cooking  Use olive oil instead of butter when cooking. Oils like olive oil, canola oil, and sunflower oil contain healthy fats. Meal planning  Learn which foods do or do not affect you. If you find out that a food tends to cause your gout symptoms to flare up, avoid eating that food. You can enjoy foods that do not cause problems. If you have any questions about a food item, talk with your dietitian or health care provider.  Limit foods high in fat, especially saturated fat. Fat makes it harder for your body to get rid of uric acid.  Choose foods that are lower in fat and are lean sources of protein. General guidelines  Limit alcohol intake to no more than 1 drink a day for nonpregnant women and 2 drinks a day for men. One drink equals 12 oz of beer, 5 oz of wine, or 1 oz of hard liquor. Alcohol can affect the way your body gets rid of uric acid.  Drink plenty of water to keep your urine clear or pale yellow. Fluids can help  remove uric acid from your body.  If directed by your health care provider, take a vitamin C supplement.  Work with your health care provider and dietitian to develop a plan to achieve or maintain a healthy weight. Losing weight can help reduce uric acid in your blood. What foods are recommended? The items listed may not be a complete list. Talk with your dietitian about what dietary choices are best for you. Foods low in purines Foods low in purines do not need to be limited. These include:  All fruits.  All low-purine vegetables, pickles, and olives.  Breads, pasta, rice, cornbread, and popcorn. Cake and other baked goods.  All dairy foods.  Eggs, nuts, and nut butters.  Spices and condiments, such as salt, herbs, and vinegar.  Plant oils, butter, and margarine.  Water, sugar-free soft drinks, tea, coffee, and cocoa.  Vegetable-based soups, broths, sauces, and gravies. Foods moderate in purines Foods moderate in purines should be limited to the amounts listed.   cup of asparagus, cauliflower, spinach, mushrooms, or green peas, each day.  2/3 cup uncooked oatmeal, each day.   cup dry wheat bran or wheat germ, each day.  2-3 ounces of meat or poultry, each day.  4-6 ounces of shellfish, such as crab, lobster, oysters, or shrimp, each day.  1 cup cooked beans, peas, or lentils, each day.  Soup, broths, or bouillon made from meat or   fish. Limit these foods as much as possible. What foods are not recommended? The items listed may not be a complete list. Talk with your dietitian about what dietary choices are best for you. Limit your intake of foods high in purines, including:  Beer and other alcohol.  Meat-based gravy or sauce.  Canned or fresh fish, such as: ? Anchovies, sardines, herring, and tuna. ? Mussels and scallops. ? Codfish, trout, and haddock.  Bacon.  Organ meats, such as: ? Liver or kidney. ? Tripe. ? Sweetbreads (thymus gland or  pancreas).  Wild game or goose.  Yeast or yeast extract supplements.  Drinks sweetened with high-fructose corn syrup. Summary  Eating a low-purine diet can help control conditions caused by too much uric acid in the body, such as gout or kidney stones.  Choose low-purine foods, limit alcohol, and limit foods high in fat.  You will learn over time which foods do or do not affect you. If you find out that a food tends to cause your gout symptoms to flare up, avoid eating that food. This information is not intended to replace advice given to you by your health care provider. Make sure you discuss any questions you have with your health care provider. Document Released: 04/28/2010 Document Revised: 12/14/2016 Document Reviewed: 02/15/2016 Elsevier Patient Education  2020 Elsevier Inc.  Gout  Gout is a condition that causes painful swelling of the joints. Gout is a type of inflammation of the joints (arthritis). This condition is caused by having too much uric acid in the body. Uric acid is a chemical that forms when the body breaks down substances called purines. Purines are important for building body proteins. When the body has too much uric acid, sharp crystals can form and build up inside the joints. This causes pain and swelling. Gout attacks can happen quickly and may be very painful (acute gout). Over time, the attacks can affect more joints and become more frequent (chronic gout). Gout can also cause uric acid to build up under the skin and inside the kidneys. What are the causes? This condition is caused by too much uric acid in your blood. This can happen because:  Your kidneys do not remove enough uric acid from your blood. This is the most common cause.  Your body makes too much uric acid. This can happen with some cancers and cancer treatments. It can also occur if your body is breaking down too many red blood cells (hemolytic anemia).  You eat too many foods that are high in  purines. These foods include organ meats and some seafood. Alcohol, especially beer, is also high in purines. A gout attack may be triggered by trauma or stress. What increases the risk? You are more likely to develop this condition if you:  Have a family history of gout.  Are female and middle-aged.  Are female and have gone through menopause.  Are obese.  Frequently drink alcohol, especially beer.  Are dehydrated.  Lose weight too quickly.  Have an organ transplant.  Have lead poisoning.  Take certain medicines, including aspirin, cyclosporine, diuretics, levodopa, and niacin.  Have kidney disease.  Have a skin condition called psoriasis. What are the signs or symptoms? An attack of acute gout happens quickly. It usually occurs in just one joint. The most common place is the big toe. Attacks often start at night. Other joints that may be affected include joints of the feet, ankle, knee, fingers, wrist, or elbow. Symptoms of this condition may   include:  Severe pain.  Warmth.  Swelling.  Stiffness.  Tenderness. The affected joint may be very painful to touch.  Shiny, red, or purple skin.  Chills and fever. Chronic gout may cause symptoms more frequently. More joints may be involved. You may also have white or yellow lumps (tophi) on your hands or feet or in other areas near your joints. How is this diagnosed? This condition is diagnosed based on your symptoms, medical history, and physical exam. You may have tests, such as:  Blood tests to measure uric acid levels.  Removal of joint fluid with a thin needle (aspiration) to look for uric acid crystals.  X-rays to look for joint damage. How is this treated? Treatment for this condition has two phases: treating an acute attack and preventing future attacks. Acute gout treatment may include medicines to reduce pain and swelling, including:  NSAIDs.  Steroids. These are strong anti-inflammatory medicines that can be  taken by mouth (orally) or injected into a joint.  Colchicine. This medicine relieves pain and swelling when it is taken soon after an attack. It can be given by mouth or through an IV. Preventive treatment may include:  Daily use of smaller doses of NSAIDs or colchicine.  Use of a medicine that reduces uric acid levels in your blood.  Changes to your diet. You may need to see a dietitian about what to eat and drink to prevent gout. Follow these instructions at home: During a gout attack   If directed, put ice on the affected area: ? Put ice in a plastic bag. ? Place a towel between your skin and the bag. ? Leave the ice on for 20 minutes, 2-3 times a day.  Raise (elevate) the affected joint above the level of your heart as often as possible.  Rest the joint as much as possible. If the affected joint is in your leg, you may be given crutches to use.  Follow instructions from your health care provider about eating or drinking restrictions. Avoiding future gout attacks  Follow a low-purine diet as told by your dietitian or health care provider. Avoid foods and drinks that are high in purines, including liver, kidney, anchovies, asparagus, herring, mushrooms, mussels, and beer.  Maintain a healthy weight or lose weight if you are overweight. If you want to lose weight, talk with your health care provider. It is important that you do not lose weight too quickly.  Start or maintain an exercise program as told by your health care provider. Eating and drinking  Drink enough fluids to keep your urine pale yellow.  If you drink alcohol: ? Limit how much you use to:  0-1 drink a day for women.  0-2 drinks a day for men. ? Be aware of how much alcohol is in your drink. In the [place] one drink equals one 12 oz bottle of beer (355 mL) one 5 oz glass of wine (148 mL), or one 1 oz glass of hard liquor (44 mL). General instructions  Take over-the-counter and prescription medicines only as  told by your health care provider.  Do not drive or use heavy machinery while taking prescription pain medicine.  Return to your normal activities as told by your health care provider. Ask your health care provider what activities are safe for you.  Keep all follow-up visits as told by your health care provider. This is important. Contact a health care provider if you have:  Another gout attack.  Continuing symptoms of a gout   attack after 10 days of treatment.  Side effects from your medicines.  Chills or a fever.  Burning pain when you urinate.  Pain in your lower back or belly. Get help right away if you:  Have severe or uncontrolled pain.  Cannot urinate. Summary  Gout is painful swelling of the joints caused by inflammation.  The most common site of pain is the big toe, but it can affect other joints in the body.  Medicines and dietary changes can help to prevent and treat gout attacks. This information is not intended to replace advice given to you by your health care provider. Make sure you discuss any questions you have with your health care provider. Document Released: 12/30/1999 Document Revised: 07/24/2017 Document Reviewed: 07/24/2017 Elsevier Patient Education  2020 Elsevier Inc.  

## 2019-05-12 ENCOUNTER — Other Ambulatory Visit: Payer: Self-pay

## 2019-06-09 ENCOUNTER — Other Ambulatory Visit: Payer: Self-pay

## 2019-06-09 ENCOUNTER — Ambulatory Visit (INDEPENDENT_AMBULATORY_CARE_PROVIDER_SITE_OTHER): Payer: BC Managed Care – PPO

## 2019-06-09 ENCOUNTER — Ambulatory Visit: Payer: BC Managed Care – PPO | Admitting: Podiatry

## 2019-06-09 DIAGNOSIS — S92501A Displaced unspecified fracture of right lesser toe(s), initial encounter for closed fracture: Secondary | ICD-10-CM | POA: Diagnosis not present

## 2019-06-11 NOTE — Progress Notes (Signed)
   HPI: 59 y.o. female presenting today as a new patient with a chief complaint of constant dull pain to the right fifth toe that began about 6 months ago in November 2020. Walking increases the pain. She has not had any treatment for the complaint. She states she was diagnosed with gout of the left great toe by her PCP in the past. Patient is here for further evaluation and treatment.   Past Medical History:  Diagnosis Date  . Abnormal uterine bleeding (AUB)   . Anxiety   . Arthritis    shoulder  . Chronic fatigue   . Environmental allergies   . Insomnia   . Perimenopausal   . Shoulder pain    inj at [facility]  . Squamous cell carcinoma      Physical Exam: General: The patient is alert and oriented x3 in no acute distress.  Dermatology: Skin is warm, dry and supple bilateral lower extremities. Negative for open lesions or macerations.  Vascular: Palpable pedal pulses bilaterally. No edema or erythema noted. Capillary refill within normal limits.  Neurological: Epicritic and protective threshold grossly intact bilaterally.   Musculoskeletal Exam: Right fifth toe slightly sits dorsally compared to the other toes. Range of motion within normal limits to all pedal and ankle joints bilateral. Muscle strength 5/5 in all groups bilateral.   Radiographic Exam:  Fracture of the right fifth proximal phalanx with displacement noted.     Assessment: 1. H/o Morton's neurectomy bilateral 3rd interspace. DOS: 2011 2. H/o Tailor's bunionectomy right. DOS: 2011 3. Fracture right 5th toe proximal phalanx with displacement    Plan of Care:  1. Patient evaluated. X-Rays reviewed.  2. Darco toe splint provided to plantar flex the toe while healing  3. Recommended good shoes.  4. Recommended OTC Motrin as needed.  5. Return to clinic in 6 weeks for follow up X-Ray.      Felecia Shelling, DPM [facility]  Dr. Felecia Shelling, DPM    [address] [place], [place] [address]                Office [phone]  Fax [phone]

## 2019-08-25 ENCOUNTER — Encounter: Payer: Self-pay | Admitting: Obstetrics and Gynecology

## 2019-08-25 ENCOUNTER — Ambulatory Visit (INDEPENDENT_AMBULATORY_CARE_PROVIDER_SITE_OTHER): Payer: BC Managed Care – PPO | Admitting: Obstetrics and Gynecology

## 2019-08-25 VITALS — BP 134/88 | HR 71 | Ht 66.0 in | Wt 135.5 lb

## 2019-08-25 DIAGNOSIS — Z01419 Encounter for gynecological examination (general) (routine) without abnormal findings: Secondary | ICD-10-CM | POA: Diagnosis not present

## 2019-08-25 DIAGNOSIS — Z79890 Hormone replacement therapy (postmenopausal): Secondary | ICD-10-CM | POA: Diagnosis not present

## 2019-08-25 DIAGNOSIS — Z1231 Encounter for screening mammogram for malignant neoplasm of breast: Secondary | ICD-10-CM

## 2019-08-25 DIAGNOSIS — N951 Menopausal and female climacteric states: Secondary | ICD-10-CM

## 2019-08-25 DIAGNOSIS — Z124 Encounter for screening for malignant neoplasm of cervix: Secondary | ICD-10-CM

## 2019-08-25 MED ORDER — ESTRADIOL-NORETHINDRONE ACET 1-0.5 MG PO TABS: 1.0000 | ORAL_TABLET | Freq: Every day | ORAL | 0 refills | Status: DC

## 2019-08-25 NOTE — Addendum Note (Signed)
Addended by: Dorian Pod on: 08/25/2019 09:14 AM   Modules accepted: Orders

## 2019-08-25 NOTE — Progress Notes (Signed)
HPI:      [name] is a 59 y.o. G1P1001 who LMP was No LMP recorded. Patient is postmenopausal.  Subjective:   She presents today for her annual examination.  She states that she ran out of HRT and "it was not working anyway".  She continues to have hot flashes vaginal dryness difficulty sleeping and mood changes.  She would consider restarting HRT if we can find a dose that was effective for her. Patient has taken HRT for several of the years of menopause.  She went into menopause approximately age 80. Maternal family history of breast cancer.    Hx: The following portions of the patient's history were reviewed and updated as appropriate:             She  has a past medical history of Abnormal uterine bleeding (AUB), Anxiety, Arthritis, Chronic fatigue, Environmental allergies, Insomnia, Perimenopausal, Shoulder pain, and Squamous cell carcinoma. She does not have any pertinent problems on file. She  has a past surgical history that includes Tonsillectomy; Nasal sinus surgery; Dilation and curettage of uterus; Endometrial ablation; skin cancer removed; Colonoscopy with propofol (N/A, 07/04/2015); and Augmentation mammaplasty (2012). Her family history includes Breast cancer (age of onset: 101) in her mother; Endometriosis in her sister; Hypertension in her father; Hypothyroidism in her father; Seizures in her brother. She  reports that she has never smoked. She has never used smokeless tobacco. She reports current alcohol use. She reports that she does not use drugs. She has a current medication list which includes the following prescription(s): conjugated estrogens and estradiol-norethindrone. She has No Known Allergies.       Review of Systems:  Review of Systems  Constitutional: Denied constitutional symptoms, night sweats, recent illness, fatigue, fever, insomnia and weight loss.  Eyes: Denied eye symptoms, eye pain, photophobia, vision change and visual disturbance.   Ears/Nose/Throat/Neck: Denied ear, nose, throat or neck symptoms, hearing loss, nasal discharge, sinus congestion and sore throat.  Cardiovascular: Denied cardiovascular symptoms, arrhythmia, chest pain/pressure, edema, exercise intolerance, orthopnea and palpitations.  Respiratory: Denied pulmonary symptoms, asthma, pleuritic pain, productive sputum, cough, dyspnea and wheezing.  Gastrointestinal: Denied, gastro-esophageal reflux, melena, nausea and vomiting.  Genitourinary: Denied genitourinary symptoms including symptomatic vaginal discharge, pelvic relaxation issues, and urinary complaints.  Musculoskeletal: Denied musculoskeletal symptoms, stiffness, swelling, muscle weakness and myalgia.  Dermatologic: Denied dermatology symptoms, rash and scar.  Neurologic: Denied neurology symptoms, dizziness, headache, neck pain and syncope.  Psychiatric: Denied psychiatric symptoms, anxiety and depression.  Endocrine: See HPI for additional information.   Meds:   Current Outpatient Medications on File Prior to Visit  Medication Sig Dispense Refill  . conjugated estrogens (PREMARIN) vaginal cream Place 0.25 Applicatorfuls vaginally 2 (two) times a week. 42.5 g 3   No current facility-administered medications on file prior to visit.    Objective:     Vitals:   08/25/19 0808  BP: 134/88  Pulse: 71              Physical examination General NAD, Conversant  HEENT Atraumatic; Op clear with mmm.  Normo-cephalic. Pupils reactive. Anicteric sclerae  Thyroid/Neck Smooth without nodularity or enlargement. Normal ROM.  Neck Supple.  Skin No rashes, lesions or ulceration. Normal palpated skin turgor. No nodularity.  Breasts: No masses or discharge.  Symmetric.  No axillary adenopathy.  Bilateral implants  Lungs: Clear to auscultation.No rales or wheezes. Normal Respiratory effort, no retractions.  Heart: NSR.  No murmurs or rubs appreciated. No periferal edema  Abdomen:  Soft.  Non-tender.  No  masses.  No HSM. No hernia  Extremities: Moves all appropriately.  Normal ROM for age. No lymphadenopathy.  Neuro: Oriented to PPT.  Normal mood. Normal affect.     Pelvic:   Vulva: Normal appearance.  No lesions.  Vagina: No lesions or abnormalities noted.  Support: Normal pelvic support.  Urethra No masses tenderness or scarring.  Meatus Normal size without lesions or prolapse.  Cervix: Normal appearance.  No lesions.  Anus: Normal exam.  No lesions.  Perineum: Normal exam.  No lesions.        Bimanual   Uterus: Normal size.  Non-tender.  Mobile.  AV.  Adnexae: No masses.  Non-tender to palpation.  Cul-de-sac: Negative for abnormality.      Assessment:    G1P1001 Patient Active Problem List   Diagnosis Date Noted  . Dyspareunia, female 05/29/2017  . Vaginal atrophy 05/29/2017  . Insomnia 05/15/2016  . Special screening for malignant neoplasms, colon   . Status post endometrial ablation 05/04/2015  . External hemorrhoid 05/04/2015  . Hemorrhagic cystitis 02/28/2015  . Menopause 10/21/2014     1. Encounter for gynecological examination without abnormal finding   2. Encounter for screening mammogram for malignant neoplasm of breast   3. Symptomatic menopausal or female climacteric states   4. Postmenopausal hormone therapy     Patient is symptomatic with menopause.   Plan:            1.  Basic Screening Recommendations The basic screening recommendations for asymptomatic women were discussed with the patient during her visit.  The age-appropriate recommendations were discussed with her and the rational for the tests reviewed.  When I am informed by the patient that another primary care physician has previously obtained the age-appropriate tests and they are up-to-date, only outstanding tests are ordered and referrals given as necessary.  Abnormal results of tests will be discussed with her when all of her results are completed.  Routine preventative health maintenance  measures emphasized: Exercise/Diet/Weight control, Tobacco Warnings, Alcohol/Substance use risks and Stress Management Pap performed-mammogram ordered-lab work ordered 2.  HRT I have discussed HRT with the patient in detail.  The risk/benefits of it were reviewed.  She understands that during menopause Estrogen decreases dramatically and that this results in an increased risk of cardiovascular disease as well as osteoporosis.  We have also discussed the fact that hot flashes often result from a decrease in Estrogen, and that by replacing Estrogen, they can often be alleviated.  We have discussed skin, vaginal and urinary tract changes that may also take place from this drop in Estrogen.  Emotional changes have also been linked to Estrogen and we have briefly discussed this.  The benefits of HRT including decrease in hot flashes, vaginal dryness, and osteoporosis were discussed.  The emotional benefit and a possible change in her cardiovascular risk profile was also reviewed.  The risks associated with Hormone Replacement Therapy were also reviewed.  The use of unopposed Estrogen and its relationship to endometrial cancer was discussed.  The addition of Progesterone and its beneficial effect on endometrial cancer was also noted.  The fact that there has been no consistent definitive studies showing an increase in breast cancer in women who use HRT was discussed with the patient.  The possible side effects including breast tenderness, fluid retention, mood changes and vaginal bleeding were discussed.  The patient was informed that this is an elective medication and that she may choose not to take  Hormone Replacement Therapy.  Literature on HRT was made available, and I believe that after answering all of the patient's questions.  Special emphasis on the WHI study, as well as several studies since that pertaining to the risks and benefits of estrogen replacement therapy were compared.  The possible limitations of  these studies were discussed including the age stratification of the WHI study.  The possible increased role of Progesterone in these studies was discussed in detail.  Different types of hormone formulation and methods of taking hormone replacement therapy were discussed. Literature on HRT was made available, and I believe that after answering all of the patient's questions she has an adequate and informed understanding of the risks and benefits of HRT. We will start Activella.  If dosing needs to be changed we will also change medication to estrogen and progesterone as separate pills.  Orders Orders Placed This Encounter  Procedures  . MM 3D SCREEN BREAST BILATERAL  . Lipid panel  . Hemoglobin A1c  . TSH     Meds ordered this encounter  Medications  . estradiol-norethindrone (ACTIVELLA) 1-0.5 MG tablet    Sig: Take 1 tablet by mouth daily.    Dispense:  90 tablet    Refill:  0          F/U  Return in about 3 months (around 11/25/2019).  Elonda Husky, M.D. 08/25/2019 8:52 AM

## 2019-08-26 ENCOUNTER — Other Ambulatory Visit: Payer: BC Managed Care – PPO

## 2019-08-26 LAB — CYTOLOGY - PAP
Comment: NEGATIVE
Diagnosis: NEGATIVE
High risk HPV: NEGATIVE

## 2019-08-28 ENCOUNTER — Other Ambulatory Visit: Payer: BC Managed Care – PPO

## 2019-08-28 ENCOUNTER — Other Ambulatory Visit: Payer: Self-pay

## 2019-08-28 DIAGNOSIS — Z01419 Encounter for gynecological examination (general) (routine) without abnormal findings: Secondary | ICD-10-CM | POA: Diagnosis not present

## 2019-08-29 ENCOUNTER — Encounter: Payer: Self-pay | Admitting: Physician Assistant

## 2019-08-29 LAB — LIPID PANEL
Chol/HDL Ratio: 3.2 ratio (ref 0.0–4.4)
Cholesterol, Total: 201 mg/dL — ABNORMAL HIGH (ref 100–199)
HDL: 62 mg/dL (ref >39)
LDL Chol Calc (NIH): 131 mg/dL — ABNORMAL HIGH (ref 0–99)
Triglycerides: 44 mg/dL (ref 0–149)
VLDL Cholesterol Cal: 8 mg/dL (ref 5–40)

## 2019-08-29 LAB — HEMOGLOBIN A1C
Est. average glucose Bld gHb Est-mCnc: 108 mg/dL
Hgb A1c MFr Bld: 5.4 % (ref 4.8–5.6)

## 2019-08-29 LAB — TSH: TSH: 0.256 u[IU]/mL — ABNORMAL LOW (ref 0.450–4.500)

## 2019-09-07 ENCOUNTER — Encounter: Payer: Self-pay | Admitting: Physician Assistant

## 2019-09-07 ENCOUNTER — Other Ambulatory Visit: Payer: Self-pay

## 2019-09-07 ENCOUNTER — Ambulatory Visit: Payer: BC Managed Care – PPO | Admitting: Physician Assistant

## 2019-09-07 VITALS — BP 126/80 | HR 66 | Temp 98.5°F | Wt 135.0 lb

## 2019-09-07 DIAGNOSIS — F5101 Primary insomnia: Secondary | ICD-10-CM | POA: Diagnosis not present

## 2019-09-07 DIAGNOSIS — E0590 Thyrotoxicosis, unspecified without thyrotoxic crisis or storm: Secondary | ICD-10-CM | POA: Diagnosis not present

## 2019-09-07 MED ORDER — TRAZODONE HCL 50 MG PO TABS: 25.0000 mg | ORAL_TABLET | Freq: Every evening | ORAL | 3 refills | Status: DC | PRN

## 2019-09-07 NOTE — Progress Notes (Signed)
Established patient visit   Patient: Kristin Little   DOB: October 11, 1960   59 y.o. Female  MRN: [id number] Visit Date: 09/07/2019  Today's healthcare provider: Margaretann Loveless, PA-C   Chief Complaint  Patient presents with  . Follow-up   Subjective    HPI  Pt is coming in today to discuss recent lab work.  Had labs done by GYN (available in Epic) and was shown to have hyperthyroidism. On review of labs it is stable compared to last year as well. She is not having any symptoms of hyperthyroidism at this time. Does have family history of hyperthyroid in her father. He required radioactive iodine treatment.   Patient Active Problem List   Diagnosis Date Noted  . Dyspareunia, female 05/29/2017  . Vaginal atrophy 05/29/2017  . Insomnia 05/15/2016  . Special screening for malignant neoplasms, colon   . Status post endometrial ablation 05/04/2015  . External hemorrhoid 05/04/2015  . Hemorrhagic cystitis 02/28/2015  . Menopause 10/21/2014   Past Medical History:  Diagnosis Date  . Abnormal uterine bleeding (AUB)   . Anxiety   . Arthritis    shoulder  . Chronic fatigue   . Environmental allergies   . Insomnia   . Perimenopausal   . Shoulder pain    inj at [facility]  . Squamous cell carcinoma    Social History   Tobacco Use  . Smoking status: Never Smoker  . Smokeless tobacco: Never Used  Vaping Use  . Vaping Use: Never used  Substance Use Topics  . Alcohol use: Yes    Comment: occas - 4 drinks/mo  . Drug use: No   No Known Allergies   Medications: Outpatient Medications Prior to Visit  Medication Sig  . Calcium-Magnesium-Vitamin D (CALCIUM 1200+D3 PO)   . estradiol-norethindrone (ACTIVELLA) 1-0.5 MG tablet Take 1 tablet by mouth daily.  Marland Kitchen conjugated estrogens (PREMARIN) vaginal cream Place 0.25 Applicatorfuls vaginally 2 (two) times a week. (Patient not taking: Reported on 09/07/2019)   No facility-administered medications prior to visit.    Review of Systems    Constitutional: Positive for fatigue. Negative for activity change, appetite change, chills, diaphoresis, fever and unexpected weight change.  Respiratory: Negative.   Cardiovascular: Negative.   Gastrointestinal: Positive for diarrhea (Occasionally). Negative for abdominal distention, abdominal pain, anal bleeding, blood in stool, constipation, nausea, rectal pain and vomiting.  Endocrine: Positive for heat intolerance. Negative for cold intolerance, polydipsia, polyphagia and polyuria.  Neurological: Negative for dizziness, light-headedness and headaches.  Psychiatric/Behavioral: Positive for sleep disturbance. Negative for agitation, behavioral problems, confusion, decreased concentration, dysphoric mood, hallucinations, self-injury and suicidal ideas. The patient is not nervous/anxious and is not hyperactive.     Last lipids Lab Results  Component Value Date   CHOL 201 (H) 08/28/2019   HDL 62 08/28/2019   LDLCALC 131 (H) 08/28/2019   TRIG 44 08/28/2019   CHOLHDL 3.2 08/28/2019   Last hemoglobin A1c Lab Results  Component Value Date   HGBA1C 5.4 08/28/2019   Last thyroid functions Lab Results  Component Value Date   TSH 0.570 09/07/2019   T4TOTAL 6.0 09/26/2018    Objective    BP 126/80 (BP Location: Left Arm, Patient Position: Sitting, Cuff Size: Normal)   Pulse 66   Temp 98.5 F (36.9 C) (Oral)   Wt 135 lb (61.2 kg)   BMI 21.79 kg/m    Physical Exam Vitals reviewed.  Constitutional:      General: She is not in acute distress.  Appearance: Normal appearance. She is well-developed and normal weight. She is not ill-appearing or diaphoretic.  HENT:     Head: Normocephalic and atraumatic.  Eyes:     General: No scleral icterus. Neck:     Thyroid: No thyroid mass, thyromegaly or thyroid tenderness.     Vascular: No JVD.     Trachea: No tracheal deviation.  Cardiovascular:     Rate and Rhythm: Normal rate and regular rhythm.     Heart sounds: Normal heart  sounds. No murmur heard.  No friction rub. No gallop.   Pulmonary:     Effort: Pulmonary effort is normal. No respiratory distress.     Breath sounds: Normal breath sounds. No wheezing or rales.  Musculoskeletal:     Cervical back: Normal range of motion and neck supple. No tenderness.  Lymphadenopathy:     Cervical: No cervical adenopathy.  Neurological:     Mental Status: She is alert.  Psychiatric:        Mood and Affect: Mood normal.       Results for orders placed or performed in visit on 09/07/19  TSH + free T4  Result Value Ref Range   TSH 0.570 0.450 - 4.500 uIU/mL   Free T4 0.94 0.82 - 1.77 ng/dL  Thyroid peroxidase antibody  Result Value Ref Range   Thyroperoxidase Ab SerPl-aCnc 9 0 - 34 IU/mL  Thyrotropin receptor autoabs  Result Value Ref Range   Thyrotropin Receptor Ab <1.10 0.00 - 1.75 IU/L    Assessment & Plan     1. Hyperthyroidism Will recheck labs and check antibodies for autoimmune source. [place] ordered to evaluate for any nodules or worrisome masses. Will f/u pending results.  - US THYROID; Future - TSH + free T4 - Thyroid peroxidase antibody - Thyrotropin receptor autoabs  2. Primary insomnia Worsening. Will start trazodone as below. May titrate up dose as needed. F/U in 4 weeks. Call if not tolerating prior to f/u.  - traZODone (DESYREL) 50 MG tablet; Take 0.5-1 tablets (25-50 mg total) by mouth at bedtime as needed for sleep.  Dispense: 30 tablet; Refill: 3   Return in about 4 weeks (around 10/05/2019) for insomnia.      Delmer Islam, PA-C, have reviewed all documentation for this visit. The documentation on 09/09/19 for the exam, diagnosis, procedures, and orders are all accurate and complete.   Reine Just  [facility] [phone] (phone) [phone] (fax)  [facility]

## 2019-09-07 NOTE — Patient Instructions (Signed)
Hyperthyroidism  Hyperthyroidism is when the thyroid gland is too active (overactive). The thyroid gland is a small gland located in the lower front part of the neck, just in front of the windpipe (trachea). This gland makes hormones that help control how the body uses food for energy (metabolism) as well as how the heart and brain function. These hormones also play a role in keeping your bones strong. When the thyroid is overactive, it produces too much of a hormone called thyroxine. What are the causes? This condition may be caused by:  Graves' disease. This is a disorder in which the body's disease-fighting system (immune system) attacks the thyroid gland. This is the most common cause.  Inflammation of the thyroid gland.  A tumor in the thyroid gland.  Use of certain medicines, including: ? Prescription thyroid hormone replacement. ? Herbal supplements that mimic thyroid hormones. ? Amiodarone therapy.  Solid or fluid-filled lumps within your thyroid gland (thyroid nodules).  Taking in a large amount of iodine from foods or medicines. What increases the risk? You are more likely to develop this condition if:  You are female.  You have a family history of thyroid conditions.  You smoke tobacco.  You use a medicine called lithium.  You take medicines that affect the immune system (immunosuppressants). What are the signs or symptoms? Symptoms of this condition include:  Nervousness.  Inability to tolerate heat.  Unexplained weight loss.  Diarrhea.  Change in the texture of hair or skin.  Heart skipping beats or making extra beats.  Rapid heart rate.  Loss of menstruation.  Shaky hands.  Fatigue.  Restlessness.  Sleep problems.  Enlarged thyroid gland or a lump in the thyroid (nodule). You may also have symptoms of Graves' disease, which may include:  Protruding eyes.  Dry eyes.  Red or swollen eyes.  Problems with vision. How is this  diagnosed? This condition may be diagnosed based on:  Your symptoms and medical history.  A physical exam.  Blood tests.  Thyroid ultrasound. This test involves using sound waves to produce images of the thyroid gland.  A thyroid scan. A radioactive substance is injected into a vein, and images show how much iodine is present in the thyroid.  Radioactive iodine uptake test (RAIU). A small amount of radioactive iodine is given by mouth to see how much iodine the thyroid absorbs after a certain amount of time. How is this treated? Treatment depends on the cause and severity of the condition. Treatment may include:  Medicines to reduce the amount of thyroid hormone your body makes.  Radioactive iodine treatment (radioiodine therapy). This involves swallowing a small dose of radioactive iodine, in capsule or liquid form, to kill thyroid cells.  Surgery to remove part or all of your thyroid gland. You may need to take thyroid hormone replacement medicine for the rest of your life after thyroid surgery.  Medicines to help manage your symptoms. Follow these instructions at home:   Take over-the-counter and prescription medicines only as told by your health care provider.  Do not use any products that contain nicotine or tobacco, such as cigarettes and e-cigarettes. If you need help quitting, ask your health care provider.  Follow any instructions from your health care provider about diet. You may be instructed to limit foods that contain iodine.  Keep all follow-up visits as told by your health care provider. This is important. ? You will need to have blood tests regularly so that your health care provider can   monitor your condition. Contact a health care provider if:  Your symptoms do not get better with treatment.  You have a fever.  You are taking thyroid hormone replacement medicine and you: ? Have symptoms of depression. ? Feel like you are tired all the time. ? Gain  weight. Get help right away if:  You have chest pain.  You have decreased alertness or a change in your awareness.  You have abdominal pain.  You feel dizzy.  You have a rapid heartbeat.  You have an irregular heartbeat.  You have difficulty breathing. Summary  The thyroid gland is a small gland located in the lower front part of the neck, just in front of the windpipe (trachea).  Hyperthyroidism is when the thyroid gland is too active (overactive) and produces too much of a hormone called thyroxine.  The most common cause is Graves' disease, a disorder in which your immune system attacks the thyroid gland.  Hyperthyroidism can cause various symptoms, such as unexplained weight loss, nervousness, inability to tolerate heat, or changes in your heartbeat.  Treatment may include medicine to reduce the amount of thyroid hormone your body makes, radioiodine therapy, surgery, or medicines to manage symptoms. This information is not intended to replace advice given to you by your health care provider. Make sure you discuss any questions you have with your health care provider. Document Revised: 12/14/2016 Document Reviewed: 12/12/2016 Elsevier Patient Education  2020 [employer].  Trazodone tablets What is this medicine? TRAZODONE (TRAZ oh done) is used to treat depression. This medicine may be used for other purposes; ask your health care provider or pharmacist if you have questions. COMMON BRAND NAME(S): Desyrel What should I tell my health care provider before I take this medicine? They need to know if you have any of these conditions:  attempted suicide or thinking about it  bipolar disorder  bleeding problems  glaucoma  heart disease, or previous heart attack  irregular heart beat  kidney or liver disease  low levels of sodium in the blood  an unusual or allergic reaction to trazodone, other medicines, foods, dyes or preservatives  pregnant or trying to get  pregnant  breast-feeding How should I use this medicine? Take this medicine by mouth with a glass of water. Follow the directions on the prescription label. Take this medicine shortly after a meal or a light snack. Take your medicine at regular intervals. Do not take your medicine more often than directed. Do not stop taking this medicine suddenly except upon the advice of your doctor. Stopping this medicine too quickly may cause serious side effects or your condition may worsen. A special MedGuide will be given to you by the pharmacist with each prescription and refill. Be sure to read this information carefully each time. Talk to your pediatrician regarding the use of this medicine in children. Special care may be needed. Overdosage: If you think you have taken too much of this medicine contact a poison control center or emergency room at once. NOTE: This medicine is only for you. Do not share this medicine with others. What if I miss a dose? If you miss a dose, take it as soon as you can. If it is almost time for your next dose, take only that dose. Do not take double or extra doses. What may interact with this medicine? Do not take this medicine with any of the following medications:  certain medicines for fungal infections like fluconazole, itraconazole, ketoconazole, posaconazole, voriconazole  cisapride  dronedarone  linezolid  MAOIs like Carbex, Eldepryl, Marplan, Nardil, and Parnate  mesoridazine  methylene blue (injected into a vein)  pimozide  saquinavir  thioridazine This medicine may also interact with the following medications:  alcohol  antiviral medicines for HIV or AIDS  aspirin and aspirin-like medicines  barbiturates like phenobarbital  certain medicines for blood pressure, heart disease, irregular heart beat  certain medicines for depression, anxiety, or psychotic disturbances  certain medicines for migraine headache like almotriptan, eletriptan,  frovatriptan, naratriptan, rizatriptan, sumatriptan, zolmitriptan  certain medicines for seizures like carbamazepine and phenytoin  certain medicines for sleep  certain medicines that treat or prevent blood clots like dalteparin, enoxaparin, warfarin  digoxin  fentanyl  lithium  NSAIDS, medicines for pain and inflammation, like ibuprofen or naproxen  other medicines that prolong the QT interval (cause an abnormal heart rhythm) like dofetilide  rasagiline  supplements like St. John's wort, kava kava, valerian  tramadol  tryptophan This list may not describe all possible interactions. Give your health care provider a list of all the medicines, herbs, non-prescription drugs, or dietary supplements you use. Also tell them if you smoke, drink alcohol, or use illegal drugs. Some items may interact with your medicine. What should I watch for while using this medicine? Tell your doctor if your symptoms do not get better or if they get worse. Visit your doctor or health care professional for regular checks on your progress. Because it may take several weeks to see the full effects of this medicine, it is important to continue your treatment as prescribed by your doctor. Patients and their families should watch out for new or worsening thoughts of suicide or depression. Also watch out for sudden changes in feelings such as feeling anxious, agitated, panicky, irritable, hostile, aggressive, impulsive, severely restless, overly excited and hyperactive, or not being able to sleep. If this happens, especially at the beginning of treatment or after a change in dose, call your health care professional. Bonita Quin may get drowsy or dizzy. Do not drive, use machinery, or do anything that needs mental alertness until you know how this medicine affects you. Do not stand or sit up quickly, especially if you are an older patient. This reduces the risk of dizzy or fainting spells. Alcohol may interfere with the  effect of this medicine. Avoid alcoholic drinks. This medicine may cause dry eyes and blurred vision. If you wear contact lenses you may feel some discomfort. Lubricating drops may help. See your eye doctor if the problem does not go away or is severe. Your mouth may get dry. Chewing sugarless gum, sucking hard candy and drinking plenty of water may help. Contact your doctor if the problem does not go away or is severe. What side effects may I notice from receiving this medicine? Side effects that you should report to your doctor or health care professional as soon as possible:  allergic reactions like skin rash, itching or hives, swelling of the face, lips, or tongue  elevated mood, decreased need for sleep, racing thoughts, impulsive behavior  confusion  fast, irregular heartbeat  feeling faint or lightheaded, falls  feeling agitated, angry, or irritable  loss of balance or coordination  painful or prolonged erections  restlessness, pacing, inability to keep still  suicidal thoughts or other mood changes  tremors  trouble sleeping  seizures  unusual bleeding or bruising Side effects that usually do not require medical attention (report to your doctor or health care professional if they continue or are bothersome):  change in sex drive or performance  change in appetite or weight  constipation  headache  muscle aches or pains  nausea This list may not describe all possible side effects. Call your doctor for medical advice about side effects. You may report side effects to FDA at 1-800-FDA-1088. Where should I keep my medicine? Keep out of the reach of children. Store at room temperature between 15 and 30 degrees C (59 to 86 degrees F). Protect from light. Keep container tightly closed. Throw away any unused medicine after the expiration date. NOTE: This sheet is a summary. It may not cover all possible information. If you have questions about this medicine, talk to  your doctor, pharmacist, or health care provider.  2020 Elsevier/Gold Standard (2017-12-24 11:46:46)

## 2019-09-08 ENCOUNTER — Telehealth: Payer: Self-pay

## 2019-09-08 LAB — THYROTROPIN RECEPTOR AUTOABS: Thyrotropin Receptor Ab: <1.1 IU/L (ref 0.00–1.75)

## 2019-09-08 LAB — TSH+FREE T4
Free T4: 0.94 ng/dL (ref 0.82–1.77)
TSH: 0.57 u[IU]/mL (ref 0.450–4.500)

## 2019-09-08 LAB — THYROID PEROXIDASE ANTIBODY: Thyroperoxidase Ab SerPl-aCnc: 9 IU/mL (ref 0–34)

## 2019-09-08 NOTE — Telephone Encounter (Signed)
-----   Message from Margaretann Loveless, [place] sent at 09/08/2019  9:48 AM EDT ----- Thyroid lab and the antibodies for autoimmune thyroid disease are normal at this time. Will f/u with [place] as discussed.

## 2019-09-08 NOTE — Telephone Encounter (Signed)
Patient advised as directed below. 

## 2019-09-10 ENCOUNTER — Ambulatory Visit: Payer: BC Managed Care – PPO

## 2019-09-11 ENCOUNTER — Ambulatory Visit
Admission: RE | Admit: 2019-09-11 | Discharge: 2019-09-11 | Disposition: A | Discharge status: Home / Self Care | Payer: BC Managed Care – PPO | Source: Ambulatory Visit | Attending: Physician Assistant | Admitting: Physician Assistant

## 2019-09-11 ENCOUNTER — Other Ambulatory Visit: Payer: Self-pay

## 2019-09-11 ENCOUNTER — Telehealth: Payer: Self-pay

## 2019-09-11 DIAGNOSIS — E042 Nontoxic multinodular goiter: Secondary | ICD-10-CM | POA: Diagnosis not present

## 2019-09-11 DIAGNOSIS — E0590 Thyrotoxicosis, unspecified without thyrotoxic crisis or storm: Secondary | ICD-10-CM | POA: Diagnosis not present

## 2019-09-11 NOTE — Telephone Encounter (Signed)
-----   Message from Margaretann Loveless, [place] sent at 09/11/2019  5:14 PM EDT ----- The thyroid gland does have 2 nodules that were noted on the [place]. Neither are large enough for biopsy and it is recommended to follow them with a repeat US in one year. We will continue to monitor the thyroid lab for you every 3-6 months in the meantime. If desired at any time I can refer to endocrinology for further evaluation.

## 2019-09-11 NOTE — Telephone Encounter (Signed)
Seen by patient Kristin Little on 09/11/2019 5:16 PM

## 2019-09-14 ENCOUNTER — Encounter: Payer: Self-pay | Admitting: Physician Assistant

## 2019-09-29 ENCOUNTER — Other Ambulatory Visit: Payer: Self-pay

## 2019-09-29 ENCOUNTER — Ambulatory Visit
Admission: RE | Admit: 2019-09-29 | Discharge: 2019-09-29 | Disposition: A | Discharge status: Home / Self Care | Payer: BC Managed Care – PPO | Source: Ambulatory Visit | Attending: Obstetrics and Gynecology | Admitting: Obstetrics and Gynecology

## 2019-09-29 DIAGNOSIS — Z1231 Encounter for screening mammogram for malignant neoplasm of breast: Secondary | ICD-10-CM | POA: Diagnosis not present

## 2019-10-07 ENCOUNTER — Telehealth (INDEPENDENT_AMBULATORY_CARE_PROVIDER_SITE_OTHER): Payer: BC Managed Care – PPO | Admitting: Physician Assistant

## 2019-10-07 DIAGNOSIS — F5101 Primary insomnia: Secondary | ICD-10-CM | POA: Diagnosis not present

## 2019-10-07 NOTE — Progress Notes (Signed)
MyChart Video Visit    Virtual Visit via Video Note   This visit type was conducted due to national recommendations for restrictions regarding the COVID-19 Pandemic (e.g. social distancing) in an effort to limit this patient's exposure and mitigate transmission in our community. This patient is at least at moderate risk for complications without adequate follow up. This format is felt to be most appropriate for this patient at this time. Physical exam was limited by quality of the video and audio technology used for the visit.   Patient location: Home Provider location: [facility]  I discussed the limitations of evaluation and management by telemedicine and the availability of in person appointments. The patient expressed understanding and agreed to proceed.  Patient: Kristin Little   DOB: October 22, 1960   59 y.o. Female  MRN: [id number] Visit Date: 10/07/2019  Today's healthcare provider: Margaretann Loveless, PA-C   Chief Complaint  Patient presents with  . Insomnia   Subjective    HPI  Follow up for Insomnia  The patient was last seen for this 4 weeks ago. Changes made at last visit include start Trazodone, may titrate as needed.  She reports excellent compliance with treatment. She feels that condition is Improved. She still has some nights that is hard to fall asleep. She still taking 1/2 of pill sometimes she has taken a whole pill. She is not having side effects.   -----------------------------------------------------------------------------------------   Patient Active Problem List   Diagnosis Date Noted  . Dyspareunia, female 05/29/2017  . Vaginal atrophy 05/29/2017  . Insomnia 05/15/2016  . Special screening for malignant neoplasms, colon   . Status post endometrial ablation 05/04/2015  . External hemorrhoid 05/04/2015  . Hemorrhagic cystitis 02/28/2015  . Menopause 10/21/2014   Past Medical History:  Diagnosis Date  . Abnormal uterine bleeding (AUB)   . Anxiety    . Arthritis    shoulder  . Chronic fatigue   . Environmental allergies   . Insomnia   . Perimenopausal   . Shoulder pain    inj at [facility]  . Squamous cell carcinoma       Medications: Outpatient Medications Prior to Visit  Medication Sig  . Calcium-Magnesium-Vitamin D (CALCIUM 1200+D3 PO)   . estradiol-norethindrone (ACTIVELLA) 1-0.5 MG tablet Take 1 tablet by mouth daily.  . traZODone (DESYREL) 50 MG tablet Take 0.5-1 tablets (25-50 mg total) by mouth at bedtime as needed for sleep.  Marland Kitchen conjugated estrogens (PREMARIN) vaginal cream Place 0.25 Applicatorfuls vaginally 2 (two) times a week. (Patient not taking: Reported on 09/07/2019)   No facility-administered medications prior to visit.    Review of Systems  Constitutional: Negative.   Respiratory: Negative.   Cardiovascular: Negative.   Psychiatric/Behavioral: Negative.     Last CBC Lab Results  Component Value Date   WBC 4.4 08/19/2018   HGB 14.2 08/19/2018   HCT 42.0 08/19/2018   MCV 95 08/19/2018   MCH 32.0 08/19/2018   RDW 11.5 (L) 08/19/2018   PLT 156 08/19/2018   Last metabolic panel Lab Results  Component Value Date   GLUCOSE 87 08/19/2018   NA 140 08/19/2018   K 4.3 08/19/2018   CL 100 08/19/2018   CO2 26 08/19/2018   BUN 15 08/19/2018   CREATININE 0.81 08/19/2018   GFRNONAA 81 08/19/2018   GFRAA 94 08/19/2018   CALCIUM 9.5 08/19/2018   PROT 7.2 08/19/2018   ALBUMIN 4.6 08/19/2018   LABGLOB 2.6 08/19/2018   AGRATIO 1.8 08/19/2018   BILITOT  0.4 08/19/2018   ALKPHOS 89 08/19/2018   AST 24 08/19/2018   ALT 20 08/19/2018      Objective    There were no vitals taken for this visit. BP Readings from Last 3 Encounters:  09/07/19 126/80  08/25/19 134/88  12/15/18 122/81   Wt Readings from Last 3 Encounters:  09/07/19 135 lb (61.2 kg)  08/25/19 135 lb 8 oz (61.5 kg)  12/15/18 139 lb (63 kg)      Physical Exam Vitals reviewed.  Constitutional:      General: She is not in acute distress.     Appearance: Normal appearance. She is well-developed. She is not ill-appearing.  HENT:     Head: Normocephalic and atraumatic.  Pulmonary:     Effort: Pulmonary effort is normal. No respiratory distress.  Musculoskeletal:     Cervical back: Normal range of motion and neck supple.  Neurological:     Mental Status: She is alert.  Psychiatric:        Mood and Affect: Mood normal.        Behavior: Behavior normal.        Thought Content: Thought content normal.        Judgment: Judgment normal.       Assessment & Plan     1. Primary insomnia Improved. She is sleeping well most nights with Trazodone 25mg . Occasionally uses 50mg . May call if symptoms worsen in the meantime.    No follow-ups on file.     I discussed the assessment and treatment plan with the patient. The patient was provided an opportunity to ask questions and all were answered. The patient agreed with the plan and demonstrated an understanding of the instructions.   The patient was advised to call back or seek an in-person evaluation if the symptoms worsen or if the condition fails to improve as anticipated.  I provided 12 minutes of non-face-to-face time during this encounter.  , PA-C, have reviewed all documentation for this visit. The documentation on 10/08/19 for the exam, diagnosis, procedures, and orders are all accurate and complete.  [facility] [phone] (phone) [phone] (fax)  [facility]

## 2019-10-08 ENCOUNTER — Telehealth: Payer: BC Managed Care – PPO | Admitting: Physician Assistant

## 2019-10-08 ENCOUNTER — Encounter: Payer: Self-pay | Admitting: Physician Assistant

## 2019-11-04 ENCOUNTER — Other Ambulatory Visit: Payer: Self-pay | Admitting: Obstetrics and Gynecology

## 2019-11-04 DIAGNOSIS — N951 Menopausal and female climacteric states: Secondary | ICD-10-CM

## 2019-11-04 DIAGNOSIS — Z79890 Hormone replacement therapy (postmenopausal): Secondary | ICD-10-CM

## 2019-11-17 ENCOUNTER — Encounter: Payer: Self-pay | Admitting: Physician Assistant

## 2019-11-17 DIAGNOSIS — E0590 Thyrotoxicosis, unspecified without thyrotoxic crisis or storm: Secondary | ICD-10-CM

## 2019-11-25 DIAGNOSIS — D2261 Melanocytic nevi of right upper limb, including shoulder: Secondary | ICD-10-CM | POA: Diagnosis not present

## 2019-11-25 DIAGNOSIS — Z85828 Personal history of other malignant neoplasm of skin: Secondary | ICD-10-CM | POA: Diagnosis not present

## 2019-11-25 DIAGNOSIS — L570 Actinic keratosis: Secondary | ICD-10-CM | POA: Diagnosis not present

## 2019-11-25 DIAGNOSIS — D225 Melanocytic nevi of trunk: Secondary | ICD-10-CM | POA: Diagnosis not present

## 2019-11-25 DIAGNOSIS — X32XXXA Exposure to sunlight, initial encounter: Secondary | ICD-10-CM | POA: Diagnosis not present

## 2019-11-25 DIAGNOSIS — R202 Paresthesia of skin: Secondary | ICD-10-CM | POA: Diagnosis not present

## 2019-12-01 ENCOUNTER — Ambulatory Visit: Payer: BC Managed Care – PPO | Admitting: Podiatry

## 2019-12-01 ENCOUNTER — Ambulatory Visit: Payer: BC Managed Care – PPO | Admitting: Obstetrics and Gynecology

## 2019-12-01 ENCOUNTER — Other Ambulatory Visit: Payer: Self-pay

## 2019-12-01 ENCOUNTER — Ambulatory Visit (INDEPENDENT_AMBULATORY_CARE_PROVIDER_SITE_OTHER): Payer: BC Managed Care – PPO

## 2019-12-01 ENCOUNTER — Encounter: Payer: Self-pay | Admitting: Obstetrics and Gynecology

## 2019-12-01 VITALS — BP 118/77 | HR 76 | Ht 66.0 in | Wt 135.5 lb

## 2019-12-01 DIAGNOSIS — N3001 Acute cystitis with hematuria: Secondary | ICD-10-CM | POA: Diagnosis not present

## 2019-12-01 DIAGNOSIS — Z79890 Hormone replacement therapy (postmenopausal): Secondary | ICD-10-CM

## 2019-12-01 DIAGNOSIS — E0590 Thyrotoxicosis, unspecified without thyrotoxic crisis or storm: Secondary | ICD-10-CM | POA: Diagnosis not present

## 2019-12-01 DIAGNOSIS — Z23 Encounter for immunization: Secondary | ICD-10-CM | POA: Diagnosis not present

## 2019-12-01 DIAGNOSIS — G5762 Lesion of plantar nerve, left lower limb: Secondary | ICD-10-CM | POA: Diagnosis not present

## 2019-12-01 DIAGNOSIS — M7752 Other enthesopathy of left foot: Secondary | ICD-10-CM | POA: Diagnosis not present

## 2019-12-01 DIAGNOSIS — N951 Menopausal and female climacteric states: Secondary | ICD-10-CM

## 2019-12-01 LAB — POCT URINALYSIS DIPSTICK
Bilirubin, UA: NEGATIVE
Glucose, UA: NEGATIVE
Ketones, UA: NEGATIVE
Nitrite, UA: NEGATIVE
Protein, UA: NEGATIVE
Spec Grav, UA: 1.01 (ref 1.010–1.025)
Urobilinogen, UA: 0.2 E.U./dL
pH, UA: 5.5 (ref 5.0–8.0)

## 2019-12-01 MED ORDER — ESTRADIOL-NORETHINDRONE ACET 1-0.5 MG PO TABS: 1.0000 | ORAL_TABLET | Freq: Every day | ORAL | 3 refills | Status: DC

## 2019-12-01 MED ORDER — NITROFURANTOIN MONOHYD MACRO 100 MG PO CAPS: 100.0000 mg | ORAL_CAPSULE | Freq: Two times a day (BID) | ORAL | 0 refills | Status: DC

## 2019-12-01 MED ORDER — MELOXICAM 15 MG PO TABS: 15.0000 mg | ORAL_TABLET | Freq: Every day | ORAL | 1 refills | Status: DC

## 2019-12-01 NOTE — Progress Notes (Signed)
HPI:      [name] is a 59 y.o. G1P1001 who LMP was No LMP recorded. Patient is postmenopausal.  Subjective:   She presents today for follow-up of HRT. She was previously on estrogen and had some problems with it and agreed to try a different combination pill. She has been on Activella and is very happy with it. She states her vaginal dryness and hot flashes have resolved. She does complain of urinary frequency and dark-colored urine. Which started approximately 2 weeks ago.    Hx: The following portions of the patient's history were reviewed and updated as appropriate:             She  has a past medical history of Abnormal uterine bleeding (AUB), Anxiety, Arthritis, Chronic fatigue, Environmental allergies, Insomnia, Perimenopausal, Shoulder pain, and Squamous cell carcinoma. She does not have any pertinent problems on file. She  has a past surgical history that includes Tonsillectomy; Nasal sinus surgery; Dilation and curettage of uterus; Endometrial ablation; skin cancer removed; Colonoscopy with propofol (N/A, 07/04/2015); and Augmentation mammaplasty (2012). Her family history includes Breast cancer (age of onset: 48) in her mother; Endometriosis in her sister; Hypertension in her father; Hypothyroidism in her father; Seizures in her brother. She  reports that she has never smoked. She has never used smokeless tobacco. She reports current alcohol use. She reports that she does not use drugs. She has a current medication list which includes the following prescription(s): calcium-magnesium-vitamin d, estradiol-norethindrone, trazodone, and nitrofurantoin (macrocrystal-monohydrate). She has No Known Allergies.       Review of Systems:  Review of Systems  Constitutional: Denied constitutional symptoms, night sweats, recent illness, fatigue, fever, insomnia and weight loss.  Eyes: Denied eye symptoms, eye pain, photophobia, vision change and visual disturbance.   Ears/Nose/Throat/Neck: Denied ear, nose, throat or neck symptoms, hearing loss, nasal discharge, sinus congestion and sore throat.  Cardiovascular: Denied cardiovascular symptoms, arrhythmia, chest pain/pressure, edema, exercise intolerance, orthopnea and palpitations.  Respiratory: Denied pulmonary symptoms, asthma, pleuritic pain, productive sputum, cough, dyspnea and wheezing.  Gastrointestinal: Denied, gastro-esophageal reflux, melena, nausea and vomiting.  Genitourinary: See HPI for additional information.  Musculoskeletal: Denied musculoskeletal symptoms, stiffness, swelling, muscle weakness and myalgia.  Dermatologic: Denied dermatology symptoms, rash and scar.  Neurologic: Denied neurology symptoms, dizziness, headache, neck pain and syncope.  Psychiatric: Denied psychiatric symptoms, anxiety and depression.  Endocrine: Denied endocrine symptoms including hot flashes and night sweats.   Meds:   Current Outpatient Medications on File Prior to Visit  Medication Sig Dispense Refill  . Calcium-Magnesium-Vitamin D (CALCIUM 1200+D3 PO)     . traZODone (DESYREL) 50 MG tablet Take 0.5-1 tablets (25-50 mg total) by mouth at bedtime as needed for sleep. 30 tablet 3   No current facility-administered medications on file prior to visit.          Objective:     Vitals:   12/01/19 0853  BP: 118/77  Pulse: 76   Filed Weights   12/01/19 0853  Weight: 135 lb 8 oz (61.5 kg)              UA consistent with UTI -hematuria noted  Assessment:    G1P1001 Patient Active Problem List   Diagnosis Date Noted  . Dyspareunia, female 05/29/2017  . Vaginal atrophy 05/29/2017  . Insomnia 05/15/2016  . Special screening for malignant neoplasms, colon   . Status post endometrial ablation 05/04/2015  . External hemorrhoid 05/04/2015  . Hemorrhagic cystitis 02/28/2015  . Menopause 10/21/2014  1. Acute cystitis with hematuria   2. Symptomatic menopausal or female climacteric states    3. Postmenopausal hormone therapy   4. Need for immunization against influenza     Patient very happy on HRT. Would like to continue.   Plan:            1. Continue HRT -Activella  2. Treat with Macrobid for acute cystitis  3. Follow-up for annual examination in August. Orders Orders Placed This Encounter  Procedures  . Urine Culture  . Flu Vaccine QUAD 36+ mos IM  . POCT urinalysis dipstick     Meds ordered this encounter  Medications  . estradiol-norethindrone (MIMVEY) 1-0.5 MG tablet    Sig: Take 1 tablet by mouth daily.    Dispense:  84 tablet    Refill:  3  . nitrofurantoin, macrocrystal-monohydrate, (MACROBID) 100 MG capsule    Sig: Take 1 capsule (100 mg total) by mouth 2 (two) times daily.    Dispense:  14 capsule    Refill:  0      F/U  Return in about 9 months (around 08/30/2020) for Annual Physical. I spent 22 minutes involved in the care of this patient preparing to see the patient by obtaining and reviewing her medical history (including labs, imaging tests and prior procedures), documenting clinical information in the electronic health record (EHR), counseling and coordinating care plans, writing and sending prescriptions, ordering tests or procedures and directly communicating with the patient by discussing pertinent items from her history and physical exam as well as detailing my assessment and plan as noted above so that she has an informed understanding.  All of her questions were answered.  Elonda Husky, M.D. 12/01/2019 9:21 AM

## 2019-12-01 NOTE — Progress Notes (Signed)
   HPI: 59 y.o. female presenting today for evaluation of left forefoot pain is been going on for several weeks now.  Patient states that it feels there is a rock underneath the ball of her foot.  She does enjoy going barefoot around the house.  She also has a history of Morton's neurectomy to the third interspace of the left foot.  She presents for further treatment and evaluation  Past Medical History:  Diagnosis Date  . Abnormal uterine bleeding (AUB)   . Anxiety   . Arthritis    shoulder  . Chronic fatigue   . Environmental allergies   . Insomnia   . Perimenopausal   . Shoulder pain    inj at [facility]  . Squamous cell carcinoma      Physical Exam: General: The patient is alert and oriented x3 in no acute distress.  Dermatology: Skin is warm, dry and supple bilateral lower extremities. Negative for open lesions or macerations.  Vascular: Palpable pedal pulses bilaterally. No edema or erythema noted. Capillary refill within normal limits.  Neurological: Epicritic and protective threshold grossly intact bilaterally.   Musculoskeletal Exam: Range of motion within normal limits to all pedal and ankle joints bilateral. Muscle strength 5/5 in all groups bilateral.  Pain on palpation range of motion to the second and third MTPJ's of the left forefoot.  Radiographic Exam:  Normal osseous mineralization. Joint spaces preserved. No fracture/dislocation/boney destruction.    Assessment: 1.  Second and third MTPJ capsulitis left 2.  Metatarsalgia left forefoot   Plan of Care:  1. Patient evaluated. X-Rays reviewed.  2.  Injection of 0.5 cc Celestone Soluspan injected into the left second and third MTPJ 3.  Prescription for meloxicam 15 mg daily 4.  Postsurgical shoe dispensed.  Weightbearing as tolerated x3 weeks 5.  Return to clinic in 4 weeks      Felecia Shelling, DPM [facility]  Dr. Felecia Shelling, DPM    [address] [place], [place] [address]                Office [phone]  Fax [phone]

## 2019-12-02 LAB — T4 AND TSH
T4, Total: 7.1 ug/dL (ref 4.5–12.0)
TSH: 0.649 u[IU]/mL (ref 0.450–4.500)

## 2019-12-03 LAB — URINE CULTURE

## 2019-12-26 ENCOUNTER — Other Ambulatory Visit: Payer: Self-pay | Admitting: Physician Assistant

## 2019-12-26 DIAGNOSIS — F5101 Primary insomnia: Secondary | ICD-10-CM

## 2020-01-20 ENCOUNTER — Telehealth (INDEPENDENT_AMBULATORY_CARE_PROVIDER_SITE_OTHER): Payer: BC Managed Care – PPO | Admitting: Physician Assistant

## 2020-01-20 ENCOUNTER — Encounter: Payer: Self-pay | Admitting: Physician Assistant

## 2020-01-20 DIAGNOSIS — R0981 Nasal congestion: Secondary | ICD-10-CM | POA: Diagnosis not present

## 2020-01-20 DIAGNOSIS — R519 Headache, unspecified: Secondary | ICD-10-CM

## 2020-01-20 DIAGNOSIS — B9789 Other viral agents as the cause of diseases classified elsewhere: Secondary | ICD-10-CM

## 2020-01-20 DIAGNOSIS — J329 Chronic sinusitis, unspecified: Secondary | ICD-10-CM

## 2020-01-20 DIAGNOSIS — J019 Acute sinusitis, unspecified: Secondary | ICD-10-CM

## 2020-01-20 MED ORDER — PREDNISONE 10 MG PO TABS: ORAL_TABLET | ORAL | 0 refills | Status: DC

## 2020-01-20 MED ORDER — PROMETHAZINE-DM 6.25-15 MG/5ML PO SYRP: 5.0000 mL | ORAL_SOLUTION | Freq: Every evening | ORAL | 0 refills | Status: DC | PRN

## 2020-01-20 NOTE — Progress Notes (Signed)
MyChart Video Visit    Virtual Visit via Video Note   This visit type was conducted due to national recommendations for restrictions regarding the COVID-19 Pandemic (e.g. social distancing) in an effort to limit this patient's exposure and mitigate transmission in our community. This patient is at least at moderate risk for complications without adequate follow up. This format is felt to be most appropriate for this patient at this time. Physical exam was limited by quality of the video and audio technology used for the visit.   Patient location: Home Provider location: Office   I discussed the limitations of evaluation and management by telemedicine and the availability of in person appointments. The patient expressed understanding and agreed to proceed.  Patient: Kristin Little   DOB: 03-Nov-1960   60 y.o. Female  MRN: [id number] Visit Date: 01/20/2020  Today's healthcare provider: Trey Sailors, PA-C   Chief Complaint  Patient presents with  . URI  I,Porsha C McClurkin,acting as a scribe for [employer], PA-C.,have documented all relevant documentation on the behalf of Trey Sailors, PA-C,as directed by  Trey Sailors, PA-C while in the presence of Trey Sailors, PA-C.  Subjective    URI  This is a new problem. The current episode started in the past 7 days. The problem has been unchanged. There has been no fever. Associated symptoms include congestion, coughing, ear pain, headaches, rhinorrhea, sinus pain and a sore throat. Pertinent negatives include no diarrhea, nausea, neck pain, sneezing, vomiting or wheezing. She has tried sleep, increased fluids, decongestant and antihistamine for the symptoms. The treatment provided mild relief.    Her most bothersome symptom is headache and nasal congestion. She reports headache since Saturday. Sleeps with ice pack and this is helpful. She had symptoms starting Saturday 01/16/2020. Got her flu shot and been vaccinated. She  has been using OTC allergy medication and mucinex DM.      Medications: Outpatient Medications Prior to Visit  Medication Sig  . Calcium-Magnesium-Vitamin D (CALCIUM 1200+D3 PO)   . estradiol-norethindrone (MIMVEY) 1-0.5 MG tablet Take 1 tablet by mouth daily.  . meloxicam (MOBIC) 15 MG tablet Take 1 tablet (15 mg total) by mouth daily.  . traZODone (DESYREL) 50 MG tablet Take 0.5-1 tablets (25-50 mg total) by mouth at bedtime as needed for sleep.  . [DISCONTINUED] nitrofurantoin, macrocrystal-monohydrate, (MACROBID) 100 MG capsule Take 1 capsule (100 mg total) by mouth 2 (two) times daily.   No facility-administered medications prior to visit.    Review of Systems  Constitutional: Negative for chills, fatigue and fever.  HENT: Positive for congestion, ear pain, rhinorrhea, sinus pressure, sinus pain and sore throat. Negative for sneezing and trouble swallowing.   Respiratory: Positive for cough. Negative for chest tightness and wheezing.   Gastrointestinal: Negative for diarrhea, nausea and vomiting.  Musculoskeletal: Negative for neck pain.  Neurological: Positive for headaches. Negative for weakness.      Objective    There were no vitals taken for this visit.   Physical Exam Constitutional:      Appearance: Normal appearance.  Pulmonary:     Effort: Pulmonary effort is normal. No respiratory distress.  Neurological:     Mental Status: She is alert.  Psychiatric:        Mood and Affect: Mood normal.        Behavior: Behavior normal.        Assessment & Plan     1. Sinus congestion  - promethazine-dextromethorphan (PROMETHAZINE-DM)  6.25-15 MG/5ML syrup; Take 5 mLs by mouth at bedtime as needed.  Dispense: 118 mL; Refill: 0 - predniSONE (DELTASONE) 10 MG tablet; Take 6 pills on day 1, 5 pills on day 2 and so on until complete.  Dispense: 21 tablet; Refill: 0  2. Nonintractable headache, unspecified chronicity pattern, unspecified headache type  -  promethazine-dextromethorphan (PROMETHAZINE-DM) 6.25-15 MG/5ML syrup; Take 5 mLs by mouth at bedtime as needed.  Dispense: 118 mL; Refill: 0  3. Viral sinusitis  - predniSONE (DELTASONE) 10 MG tablet; Take 6 pills on day 1, 5 pills on day 2 and so on until complete.  Dispense: 21 tablet; Refill: 0   No follow-ups on file.     I discussed the assessment and treatment plan with the patient. The patient was provided an opportunity to ask questions and all were answered. The patient agreed with the plan and demonstrated an understanding of the instructions.   The patient was advised to call back or seek an in-person evaluation if the symptoms worsen or if the condition fails to improve as anticipated.   ITrey Sailors, PA-C, have reviewed all documentation for this visit. The documentation on 01/27/20 for the exam, diagnosis, procedures, and orders are all accurate and complete.  The entirety of the information documented in the History of Present Illness, Review of Systems and Physical Exam were personally obtained by me. Portions of this information were initially documented by [facility] and reviewed by me for thoroughness and accuracy.    Maryella Shivers [facility] [phone] (phone) [phone] (fax)  [facility]

## 2020-01-27 ENCOUNTER — Encounter: Payer: Self-pay | Admitting: Physician Assistant

## 2020-01-27 MED ORDER — AMOXICILLIN-POT CLAVULANATE 875-125 MG PO TABS: 1.0000 | ORAL_TABLET | Freq: Two times a day (BID) | ORAL | 0 refills | Status: AC

## 2020-02-08 DIAGNOSIS — M9903 Segmental and somatic dysfunction of lumbar region: Secondary | ICD-10-CM | POA: Diagnosis not present

## 2020-02-08 DIAGNOSIS — M5451 Vertebrogenic low back pain: Secondary | ICD-10-CM | POA: Diagnosis not present

## 2020-02-08 DIAGNOSIS — M5416 Radiculopathy, lumbar region: Secondary | ICD-10-CM | POA: Diagnosis not present

## 2020-02-08 DIAGNOSIS — M9904 Segmental and somatic dysfunction of sacral region: Secondary | ICD-10-CM | POA: Diagnosis not present

## 2020-02-09 DIAGNOSIS — M5416 Radiculopathy, lumbar region: Secondary | ICD-10-CM | POA: Diagnosis not present

## 2020-02-09 DIAGNOSIS — M9903 Segmental and somatic dysfunction of lumbar region: Secondary | ICD-10-CM | POA: Diagnosis not present

## 2020-02-09 DIAGNOSIS — M9904 Segmental and somatic dysfunction of sacral region: Secondary | ICD-10-CM | POA: Diagnosis not present

## 2020-02-09 DIAGNOSIS — M5451 Vertebrogenic low back pain: Secondary | ICD-10-CM | POA: Diagnosis not present

## 2020-02-10 DIAGNOSIS — M9904 Segmental and somatic dysfunction of sacral region: Secondary | ICD-10-CM | POA: Diagnosis not present

## 2020-02-10 DIAGNOSIS — M5416 Radiculopathy, lumbar region: Secondary | ICD-10-CM | POA: Diagnosis not present

## 2020-02-10 DIAGNOSIS — M5451 Vertebrogenic low back pain: Secondary | ICD-10-CM | POA: Diagnosis not present

## 2020-02-10 DIAGNOSIS — M9903 Segmental and somatic dysfunction of lumbar region: Secondary | ICD-10-CM | POA: Diagnosis not present

## 2020-02-11 DIAGNOSIS — M5451 Vertebrogenic low back pain: Secondary | ICD-10-CM | POA: Diagnosis not present

## 2020-02-11 DIAGNOSIS — M5416 Radiculopathy, lumbar region: Secondary | ICD-10-CM | POA: Diagnosis not present

## 2020-02-11 DIAGNOSIS — M9904 Segmental and somatic dysfunction of sacral region: Secondary | ICD-10-CM | POA: Diagnosis not present

## 2020-02-11 DIAGNOSIS — M9903 Segmental and somatic dysfunction of lumbar region: Secondary | ICD-10-CM | POA: Diagnosis not present

## 2020-02-12 DIAGNOSIS — M9904 Segmental and somatic dysfunction of sacral region: Secondary | ICD-10-CM | POA: Diagnosis not present

## 2020-02-12 DIAGNOSIS — M9903 Segmental and somatic dysfunction of lumbar region: Secondary | ICD-10-CM | POA: Diagnosis not present

## 2020-02-12 DIAGNOSIS — M5416 Radiculopathy, lumbar region: Secondary | ICD-10-CM | POA: Diagnosis not present

## 2020-02-12 DIAGNOSIS — M5451 Vertebrogenic low back pain: Secondary | ICD-10-CM | POA: Diagnosis not present

## 2020-02-15 DIAGNOSIS — M9904 Segmental and somatic dysfunction of sacral region: Secondary | ICD-10-CM | POA: Diagnosis not present

## 2020-02-15 DIAGNOSIS — M5451 Vertebrogenic low back pain: Secondary | ICD-10-CM | POA: Diagnosis not present

## 2020-02-15 DIAGNOSIS — M9903 Segmental and somatic dysfunction of lumbar region: Secondary | ICD-10-CM | POA: Diagnosis not present

## 2020-02-15 DIAGNOSIS — M5416 Radiculopathy, lumbar region: Secondary | ICD-10-CM | POA: Diagnosis not present

## 2020-02-16 DIAGNOSIS — M5451 Vertebrogenic low back pain: Secondary | ICD-10-CM | POA: Diagnosis not present

## 2020-02-16 DIAGNOSIS — M9904 Segmental and somatic dysfunction of sacral region: Secondary | ICD-10-CM | POA: Diagnosis not present

## 2020-02-16 DIAGNOSIS — M9903 Segmental and somatic dysfunction of lumbar region: Secondary | ICD-10-CM | POA: Diagnosis not present

## 2020-02-16 DIAGNOSIS — M5416 Radiculopathy, lumbar region: Secondary | ICD-10-CM | POA: Diagnosis not present

## 2020-02-18 DIAGNOSIS — M5416 Radiculopathy, lumbar region: Secondary | ICD-10-CM | POA: Diagnosis not present

## 2020-02-18 DIAGNOSIS — M5451 Vertebrogenic low back pain: Secondary | ICD-10-CM | POA: Diagnosis not present

## 2020-02-18 DIAGNOSIS — M9904 Segmental and somatic dysfunction of sacral region: Secondary | ICD-10-CM | POA: Diagnosis not present

## 2020-02-18 DIAGNOSIS — M9903 Segmental and somatic dysfunction of lumbar region: Secondary | ICD-10-CM | POA: Diagnosis not present

## 2020-02-22 DIAGNOSIS — M9903 Segmental and somatic dysfunction of lumbar region: Secondary | ICD-10-CM | POA: Diagnosis not present

## 2020-02-22 DIAGNOSIS — M5416 Radiculopathy, lumbar region: Secondary | ICD-10-CM | POA: Diagnosis not present

## 2020-02-22 DIAGNOSIS — M9904 Segmental and somatic dysfunction of sacral region: Secondary | ICD-10-CM | POA: Diagnosis not present

## 2020-02-22 DIAGNOSIS — M5451 Vertebrogenic low back pain: Secondary | ICD-10-CM | POA: Diagnosis not present

## 2020-02-24 DIAGNOSIS — M5451 Vertebrogenic low back pain: Secondary | ICD-10-CM | POA: Diagnosis not present

## 2020-02-24 DIAGNOSIS — M5416 Radiculopathy, lumbar region: Secondary | ICD-10-CM | POA: Diagnosis not present

## 2020-02-24 DIAGNOSIS — M9904 Segmental and somatic dysfunction of sacral region: Secondary | ICD-10-CM | POA: Diagnosis not present

## 2020-02-24 DIAGNOSIS — M9903 Segmental and somatic dysfunction of lumbar region: Secondary | ICD-10-CM | POA: Diagnosis not present

## 2020-02-26 DIAGNOSIS — M9904 Segmental and somatic dysfunction of sacral region: Secondary | ICD-10-CM | POA: Diagnosis not present

## 2020-02-26 DIAGNOSIS — M5451 Vertebrogenic low back pain: Secondary | ICD-10-CM | POA: Diagnosis not present

## 2020-02-26 DIAGNOSIS — M9903 Segmental and somatic dysfunction of lumbar region: Secondary | ICD-10-CM | POA: Diagnosis not present

## 2020-02-26 DIAGNOSIS — M5416 Radiculopathy, lumbar region: Secondary | ICD-10-CM | POA: Diagnosis not present

## 2020-02-29 DIAGNOSIS — M5416 Radiculopathy, lumbar region: Secondary | ICD-10-CM | POA: Diagnosis not present

## 2020-02-29 DIAGNOSIS — M5451 Vertebrogenic low back pain: Secondary | ICD-10-CM | POA: Diagnosis not present

## 2020-02-29 DIAGNOSIS — M9904 Segmental and somatic dysfunction of sacral region: Secondary | ICD-10-CM | POA: Diagnosis not present

## 2020-02-29 DIAGNOSIS — M9903 Segmental and somatic dysfunction of lumbar region: Secondary | ICD-10-CM | POA: Diagnosis not present

## 2020-03-02 DIAGNOSIS — M5416 Radiculopathy, lumbar region: Secondary | ICD-10-CM | POA: Diagnosis not present

## 2020-03-02 DIAGNOSIS — M5451 Vertebrogenic low back pain: Secondary | ICD-10-CM | POA: Diagnosis not present

## 2020-03-02 DIAGNOSIS — M9904 Segmental and somatic dysfunction of sacral region: Secondary | ICD-10-CM | POA: Diagnosis not present

## 2020-03-02 DIAGNOSIS — M9903 Segmental and somatic dysfunction of lumbar region: Secondary | ICD-10-CM | POA: Diagnosis not present

## 2020-05-17 ENCOUNTER — Other Ambulatory Visit: Payer: Self-pay | Admitting: Podiatry

## 2020-07-20 ENCOUNTER — Other Ambulatory Visit: Payer: Self-pay

## 2020-07-20 ENCOUNTER — Encounter: Payer: Self-pay | Admitting: Obstetrics and Gynecology

## 2020-07-20 ENCOUNTER — Ambulatory Visit: Payer: BC Managed Care – PPO | Admitting: Obstetrics and Gynecology

## 2020-07-20 VITALS — BP 133/86 | HR 60 | Ht 66.0 in | Wt 142.6 lb

## 2020-07-20 DIAGNOSIS — R300 Dysuria: Secondary | ICD-10-CM

## 2020-07-20 DIAGNOSIS — N3001 Acute cystitis with hematuria: Secondary | ICD-10-CM | POA: Diagnosis not present

## 2020-07-20 LAB — POCT URINALYSIS DIPSTICK
Bilirubin, UA: NEGATIVE
Glucose, UA: NEGATIVE
Ketones, UA: NEGATIVE
Leukocytes, UA: NEGATIVE
Nitrite, UA: NEGATIVE
Protein, UA: NEGATIVE
Spec Grav, UA: <=1.005 — AB (ref 1.010–1.025)
Urobilinogen, UA: 0.2 E.U./dL
pH, UA: 6.5 (ref 5.0–8.0)

## 2020-07-20 MED ORDER — NITROFURANTOIN MONOHYD MACRO 100 MG PO CAPS: 100.0000 mg | ORAL_CAPSULE | Freq: Two times a day (BID) | ORAL | 0 refills | Status: DC

## 2020-07-20 NOTE — Progress Notes (Signed)
HPI:      [name] is a 60 y.o. G1P1001 who LMP was No LMP recorded. Patient is postmenopausal.  Subjective:   She presents today complaining of recent onset vulvar burning and symptoms with urination.  Denies change in vaginal discharge.  Denies odor.  Patient states the burning does not feel like a yeast infection.    Hx: The following portions of the patient's history were reviewed and updated as appropriate:             She  has a past medical history of Abnormal uterine bleeding (AUB), Anxiety, Arthritis, Chronic fatigue, Environmental allergies, Insomnia, Perimenopausal, Shoulder pain, and Squamous cell carcinoma. She does not have any pertinent problems on file. She  has a past surgical history that includes Tonsillectomy; Nasal sinus surgery; Dilation and curettage of uterus; Endometrial ablation; skin cancer removed; Colonoscopy with propofol (N/A, 07/04/2015); and Augmentation mammaplasty (2012). Her family history includes Breast cancer (age of onset: 40) in her mother; Endometriosis in her sister; Hypertension in her father; Hypothyroidism in her father; Seizures in her brother. She  reports that she has never smoked. She has never used smokeless tobacco. She reports current alcohol use. She reports that she does not use drugs. She has a current medication list which includes the following prescription(s): calcium-magnesium-vitamin d, estradiol-norethindrone, meloxicam, nitrofurantoin (macrocrystal-monohydrate), trazodone, prednisone, and promethazine-dextromethorphan. She has No Known Allergies.       Review of Systems:  Review of Systems  Constitutional: Denied constitutional symptoms, night sweats, recent illness, fatigue, fever, insomnia and weight loss.  Eyes: Denied eye symptoms, eye pain, photophobia, vision change and visual disturbance.  Ears/Nose/Throat/Neck: Denied ear, nose, throat or neck symptoms, hearing loss, nasal discharge, sinus congestion and sore  throat.  Cardiovascular: Denied cardiovascular symptoms, arrhythmia, chest pain/pressure, edema, exercise intolerance, orthopnea and palpitations.  Respiratory: Denied pulmonary symptoms, asthma, pleuritic pain, productive sputum, cough, dyspnea and wheezing.  Gastrointestinal: Denied, gastro-esophageal reflux, melena, nausea and vomiting.  Genitourinary: See HPI for additional information.  Musculoskeletal: Denied musculoskeletal symptoms, stiffness, swelling, muscle weakness and myalgia.  Dermatologic: Denied dermatology symptoms, rash and scar.  Neurologic: Denied neurology symptoms, dizziness, headache, neck pain and syncope.  Psychiatric: Denied psychiatric symptoms, anxiety and depression.  Endocrine: Denied endocrine symptoms including hot flashes and night sweats.   Meds:   Current Outpatient Medications on File Prior to Visit  Medication Sig Dispense Refill   Calcium-Magnesium-Vitamin D (CALCIUM 1200+D3 PO)      estradiol-norethindrone (MIMVEY) 1-0.5 MG tablet Take 1 tablet by mouth daily. 84 tablet 3   meloxicam (MOBIC) 15 MG tablet TAKE 1 TABLET(15 MG) BY MOUTH DAILY 30 tablet 1   traZODone (DESYREL) 50 MG tablet Take 0.5-1 tablets (25-50 mg total) by mouth at bedtime as needed for sleep. 30 tablet 3   predniSONE (DELTASONE) 10 MG tablet Take 6 pills on day 1, 5 pills on day 2 and so on until complete. (Patient not taking: Reported on 07/20/2020) 21 tablet 0   promethazine-dextromethorphan (PROMETHAZINE-DM) 6.25-15 MG/5ML syrup Take 5 mLs by mouth at bedtime as needed. (Patient not taking: Reported on 07/20/2020) 118 mL 0   No current facility-administered medications on file prior to visit.          Objective:     Vitals:   07/20/20 0935  BP: 133/86  Pulse: 60   Filed Weights   07/20/20 0935  Weight: 142 lb 9.6 oz (64.7 kg)  UA shows hematuria  Assessment:    G1P1001 Patient Active Problem List   Diagnosis Date Noted   Dyspareunia, female  05/29/2017   Vaginal atrophy 05/29/2017   Insomnia 05/15/2016   Special screening for malignant neoplasms, colon    Status post endometrial ablation 05/04/2015   External hemorrhoid 05/04/2015   Hemorrhagic cystitis 02/28/2015   Menopause 10/21/2014     1. Dysuria   2. Acute cystitis with hematuria     Likely UTI causing symptoms   Plan:            1.  Macrobid  2.  Advised increase fluid intake.  3.  If symptoms do not resolve with antibiotics -consider further cultures or wet prep. Orders Orders Placed This Encounter  Procedures   POCT urinalysis dipstick     Meds ordered this encounter  Medications   nitrofurantoin, macrocrystal-monohydrate, (MACROBID) 100 MG capsule    Sig: Take 1 capsule (100 mg total) by mouth 2 (two) times daily.    Dispense:  14 capsule    Refill:  0      F/U  Return for Pt to contact us if symptoms worsen. I spent 21 minutes involved in the care of this patient preparing to see the patient by obtaining and reviewing her medical history (including labs, imaging tests and prior procedures), documenting clinical information in the electronic health record (EHR), counseling and coordinating care plans, writing and sending prescriptions, ordering tests or procedures and directly communicating with the patient by discussing pertinent items from her history and physical exam as well as detailing my assessment and plan as noted above so that she has an informed understanding.  All of her questions were answered.  Elonda Husky, M.D. 07/20/2020 9:56 AM

## 2020-08-25 ENCOUNTER — Other Ambulatory Visit: Payer: Self-pay

## 2020-08-25 ENCOUNTER — Ambulatory Visit (INDEPENDENT_AMBULATORY_CARE_PROVIDER_SITE_OTHER): Payer: BC Managed Care – PPO | Admitting: Obstetrics and Gynecology

## 2020-08-25 ENCOUNTER — Encounter: Payer: Self-pay | Admitting: Obstetrics and Gynecology

## 2020-08-25 ENCOUNTER — Other Ambulatory Visit: Payer: Self-pay | Admitting: Obstetrics and Gynecology

## 2020-08-25 VITALS — BP 128/90 | HR 97 | Ht 66.0 in | Wt 140.7 lb

## 2020-08-25 DIAGNOSIS — Z01419 Encounter for gynecological examination (general) (routine) without abnormal findings: Secondary | ICD-10-CM

## 2020-08-25 DIAGNOSIS — R399 Unspecified symptoms and signs involving the genitourinary system: Secondary | ICD-10-CM

## 2020-08-25 DIAGNOSIS — Z1231 Encounter for screening mammogram for malignant neoplasm of breast: Secondary | ICD-10-CM

## 2020-08-25 LAB — POCT URINALYSIS DIPSTICK
Bilirubin, UA: NEGATIVE
Glucose, UA: NEGATIVE
Leukocytes, UA: NEGATIVE
Nitrite, UA: NEGATIVE
Protein, UA: NEGATIVE
Spec Grav, UA: 1.02 (ref 1.010–1.025)
Urobilinogen, UA: 0.2 E.U./dL
pH, UA: 5 (ref 5.0–8.0)

## 2020-08-25 NOTE — Progress Notes (Signed)
HPI:      [name] is a 60 y.o. G1P1001 who LMP was No LMP recorded. Patient is postmenopausal.  Subjective:   She presents today for her annual examination.  She likes being on HRT.  She would like to continue.  She states that she still has occasional burning with urination but it is not as bad as in July. Reports that she occasionally has swings of difficulty sleeping/insomnia.  She reports a thyroid abnormality at her last PCP visit but was rechecked and found to be normal. She is fasting for blood work today. Denies new vaginal discharge itching burning or new sexual partners    Hx: The following portions of the patient's history were reviewed and updated as appropriate:             She  has a past medical history of Abnormal uterine bleeding (AUB), Anxiety, Arthritis, Chronic fatigue, Environmental allergies, Insomnia, Perimenopausal, Shoulder pain, and Squamous cell carcinoma. She does not have any pertinent problems on file. She  has a past surgical history that includes Tonsillectomy; Nasal sinus surgery; Dilation and curettage of uterus; Endometrial ablation; skin cancer removed; Colonoscopy with propofol (N/A, 07/04/2015); and Augmentation mammaplasty (2012). Her family history includes Breast cancer (age of onset: 44) in her mother; Endometriosis in her sister; Hypertension in her father; Hypothyroidism in her father; Seizures in her brother. She  reports that she has never smoked. She has never used smokeless tobacco. She reports current alcohol use. She reports that she does not use drugs. She has a current medication list which includes the following prescription(s): calcium-magnesium-vitamin d, estradiol-norethindrone, meloxicam, nitrofurantoin (macrocrystal-monohydrate), trazodone, prednisone, and promethazine-dextromethorphan. She has No Known Allergies.       Review of Systems:  Review of Systems  Constitutional: Denied constitutional symptoms, night sweats, recent  illness, fatigue, fever, insomnia and weight loss.  Eyes: Denied eye symptoms, eye pain, photophobia, vision change and visual disturbance.  Ears/Nose/Throat/Neck: Denied ear, nose, throat or neck symptoms, hearing loss, nasal discharge, sinus congestion and sore throat.  Cardiovascular: Denied cardiovascular symptoms, arrhythmia, chest pain/pressure, edema, exercise intolerance, orthopnea and palpitations.  Respiratory: Denied pulmonary symptoms, asthma, pleuritic pain, productive sputum, cough, dyspnea and wheezing.  Gastrointestinal: Denied, gastro-esophageal reflux, melena, nausea and vomiting.  Genitourinary: See HPI for additional information.  Musculoskeletal: Denied musculoskeletal symptoms, stiffness, swelling, muscle weakness and myalgia.  Dermatologic: Denied dermatology symptoms, rash and scar.  Neurologic: Denied neurology symptoms, dizziness, headache, neck pain and syncope.  Psychiatric: Denied psychiatric symptoms, anxiety and depression.  Endocrine: Denied endocrine symptoms including hot flashes and night sweats.   Meds:   Current Outpatient Medications on File Prior to Visit  Medication Sig Dispense Refill   Calcium-Magnesium-Vitamin D (CALCIUM 1200+D3 PO)      estradiol-norethindrone (MIMVEY) 1-0.5 MG tablet Take 1 tablet by mouth daily. 84 tablet 3   meloxicam (MOBIC) 15 MG tablet TAKE 1 TABLET(15 MG) BY MOUTH DAILY 30 tablet 1   nitrofurantoin, macrocrystal-monohydrate, (MACROBID) 100 MG capsule Take 1 capsule (100 mg total) by mouth 2 (two) times daily. 14 capsule 0   traZODone (DESYREL) 50 MG tablet Take 0.5-1 tablets (25-50 mg total) by mouth at bedtime as needed for sleep. 30 tablet 3   predniSONE (DELTASONE) 10 MG tablet Take 6 pills on day 1, 5 pills on day 2 and so on until complete. (Patient not taking: No sig reported) 21 tablet 0   promethazine-dextromethorphan (PROMETHAZINE-DM) 6.25-15 MG/5ML syrup Take 5 mLs by mouth at bedtime as needed. (Patient  not taking:  No sig reported) 118 mL 0   No current facility-administered medications on file prior to visit.       Objective:     Vitals:   08/25/20 0814  BP: 128/90  Pulse: 97    Filed Weights   08/25/20 0814  Weight: 140 lb 11.2 oz (63.8 kg)              Physical examination General NAD, Conversant  HEENT Atraumatic; Op clear with mmm.  Normo-cephalic. Pupils reactive. Anicteric sclerae  Thyroid/Neck Smooth without nodularity or enlargement. Normal ROM.  Neck Supple.  Skin No rashes, lesions or ulceration. Normal palpated skin turgor. No nodularity.  Breasts: No masses or discharge.  Symmetric.  No axillary adenopathy.  Lungs: Clear to auscultation.No rales or wheezes. Normal Respiratory effort, no retractions.  Heart: NSR.  No murmurs or rubs appreciated. No periferal edema  Abdomen: Soft.  Non-tender.  No masses.  No HSM. No hernia  Extremities: Moves all appropriately.  Normal ROM for age. No lymphadenopathy.  Neuro: Oriented to PPT.  Normal mood. Normal affect.     Pelvic:   Vulva: Normal appearance.  No lesions.  Vagina: No lesions or abnormalities noted.  Support: Normal pelvic support.  Urethra No masses tenderness or scarring.  Meatus Normal size without lesions or prolapse.  Cervix: Normal appearance.  No lesions.  Anus: Normal exam.  No lesions.  Perineum: Normal exam.  No lesions.        Bimanual   Uterus: Normal size.  Non-tender.  Mobile.  AV.  Adnexae: No masses.  Non-tender to palpation.  Cul-de-sac: Negative for abnormality.   UA: Small amount of hematuria  Assessment:    G1P1001 Patient Active Problem List   Diagnosis Date Noted   Dyspareunia, female 05/29/2017   Vaginal atrophy 05/29/2017   Insomnia 05/15/2016   Special screening for malignant neoplasms, colon    Status post endometrial ablation 05/04/2015   External hemorrhoid 05/04/2015   Hemorrhagic cystitis 02/28/2015   Menopause 10/21/2014     1. UTI symptoms   2. Well woman exam with  routine gynecological exam     Patient again with mild UTI symptoms and small amount of hematuria.  Not sure if this is another UTI or if patient having some other issue.   Plan:            1.  Basic Screening Recommendations The basic screening recommendations for asymptomatic women were discussed with the patient during her visit.  The age-appropriate recommendations were discussed with her and the rational for the tests reviewed.  When I am informed by the patient that another primary care physician has previously obtained the age-appropriate tests and they are up-to-date, only outstanding tests are ordered and referrals given as necessary.  Abnormal results of tests will be discussed with her when all of her results are completed.  Routine preventative health maintenance measures emphasized: Exercise/Diet/Weight control, Tobacco Warnings, Alcohol/Substance use risks and Stress Management Blood work today-mammogram ordered 2.  Urine for culture 3.  If urine comes back with UTI will treat appropriately.  If patient becomes symptomatic with worsening dysuria or vaginal symptoms would recommend complete work-up including Nuswab.   Orders Orders Placed This Encounter  Procedures   MM DIGITAL SCREENING BILATERAL   CBC   Basic metabolic panel   Lipid panel   Hemoglobin A1c   Thyroid Panel With TSH   POCT urinalysis dipstick    No orders of the defined types were placed  in this encounter.         F/U  No follow-ups on file.  Elonda Husky, M.D. 08/25/2020 8:39 AM

## 2020-08-25 NOTE — Progress Notes (Signed)
Urine cul

## 2020-08-26 LAB — BASIC METABOLIC PANEL
BUN/Creatinine Ratio: 19 (ref 9–23)
BUN: 17 mg/dL (ref 6–24)
CO2: 25 mmol/L (ref 20–29)
Calcium: 9.3 mg/dL (ref 8.7–10.2)
Chloride: 103 mmol/L (ref 96–106)
Creatinine, Ser: 0.91 mg/dL (ref 0.57–1.00)
Glucose: 83 mg/dL (ref 65–99)
Potassium: 4 mmol/L (ref 3.5–5.2)
Sodium: 139 mmol/L (ref 134–144)
eGFR: 73 mL/min/{1.73_m2} (ref >59)

## 2020-08-26 LAB — THYROID PANEL WITH TSH
Free Thyroxine Index: 1.9 (ref 1.2–4.9)
T3 Uptake Ratio: 26 % (ref 24–39)
T4, Total: 7.3 ug/dL (ref 4.5–12.0)
TSH: 0.469 u[IU]/mL (ref 0.450–4.500)

## 2020-08-26 LAB — CBC
Hematocrit: 39.6 % (ref 34.0–46.6)
Hemoglobin: 13.4 g/dL (ref 11.1–15.9)
MCH: 32.7 pg (ref 26.6–33.0)
MCHC: 33.8 g/dL (ref 31.5–35.7)
MCV: 97 fL (ref 79–97)
Platelets: 195 10*3/uL (ref 150–450)
RBC: 4.1 x10E6/uL (ref 3.77–5.28)
RDW: 11.5 % — ABNORMAL LOW (ref 11.7–15.4)
WBC: 4.4 10*3/uL (ref 3.4–10.8)

## 2020-08-26 LAB — HEMOGLOBIN A1C
Est. average glucose Bld gHb Est-mCnc: 108 mg/dL
Hgb A1c MFr Bld: 5.4 % (ref 4.8–5.6)

## 2020-08-26 LAB — LIPID PANEL
Chol/HDL Ratio: 3.3 ratio (ref 0.0–4.4)
Cholesterol, Total: 190 mg/dL (ref 100–199)
HDL: 57 mg/dL (ref >39)
LDL Chol Calc (NIH): 121 mg/dL — ABNORMAL HIGH (ref 0–99)
Triglycerides: 62 mg/dL (ref 0–149)
VLDL Cholesterol Cal: 12 mg/dL (ref 5–40)

## 2020-08-27 LAB — URINE CULTURE

## 2020-09-29 ENCOUNTER — Other Ambulatory Visit: Payer: Self-pay

## 2020-09-29 ENCOUNTER — Ambulatory Visit
Admission: RE | Admit: 2020-09-29 | Discharge: 2020-09-29 | Disposition: A | Discharge status: Home / Self Care | Payer: BC Managed Care – PPO | Source: Ambulatory Visit | Attending: Obstetrics and Gynecology | Admitting: Obstetrics and Gynecology

## 2020-09-29 DIAGNOSIS — Z1231 Encounter for screening mammogram for malignant neoplasm of breast: Secondary | ICD-10-CM | POA: Diagnosis not present

## 2020-10-15 ENCOUNTER — Other Ambulatory Visit: Payer: Self-pay | Admitting: Obstetrics and Gynecology

## 2020-10-15 DIAGNOSIS — N951 Menopausal and female climacteric states: Secondary | ICD-10-CM

## 2020-10-15 DIAGNOSIS — Z79890 Hormone replacement therapy (postmenopausal): Secondary | ICD-10-CM

## 2020-11-09 ENCOUNTER — Encounter: Payer: Self-pay | Admitting: Physician Assistant

## 2020-11-09 ENCOUNTER — Telehealth: Payer: BC Managed Care – PPO | Admitting: Physician Assistant

## 2020-11-09 DIAGNOSIS — J0110 Acute frontal sinusitis, unspecified: Secondary | ICD-10-CM | POA: Diagnosis not present

## 2020-11-09 MED ORDER — PREDNISONE 10 MG (21) PO TBPK: ORAL_TABLET | ORAL | 0 refills | Status: DC

## 2020-11-09 MED ORDER — AMOXICILLIN-POT CLAVULANATE 875-125 MG PO TABS: 1.0000 | ORAL_TABLET | Freq: Two times a day (BID) | ORAL | 0 refills | Status: DC

## 2020-11-09 NOTE — Patient Instructions (Signed)
Girard Cooter, thank you for joining Piedad Climes, PA-C for today's virtual visit.  While this provider is not your primary care provider (PCP), if your PCP is located in our provider database this encounter information will be shared with them immediately following your visit.  Consent: (Patient) Kristin Little provided verbal consent for this virtual visit at the beginning of the encounter.  Current Medications:  Current Outpatient Medications:    Calcium-Magnesium-Vitamin D (CALCIUM 1200+D3 PO), , Disp: , Rfl:    meloxicam (MOBIC) 15 MG tablet, TAKE 1 TABLET(15 MG) BY MOUTH DAILY, Disp: 30 tablet, Rfl: 1   MIMVEY 1-0.5 MG tablet, TAKE 1 TABLET BY MOUTH DAILY, Disp: 84 tablet, Rfl: 3   nitrofurantoin, macrocrystal-monohydrate, (MACROBID) 100 MG capsule, Take 1 capsule (100 mg total) by mouth 2 (two) times daily., Disp: 14 capsule, Rfl: 0   predniSONE (DELTASONE) 10 MG tablet, Take 6 pills on day 1, 5 pills on day 2 and so on until complete. (Patient not taking: No sig reported), Disp: 21 tablet, Rfl: 0   promethazine-dextromethorphan (PROMETHAZINE-DM) 6.25-15 MG/5ML syrup, Take 5 mLs by mouth at bedtime as needed. (Patient not taking: No sig reported), Disp: 118 mL, Rfl: 0   traZODone (DESYREL) 50 MG tablet, Take 0.5-1 tablets (25-50 mg total) by mouth at bedtime as needed for sleep., Disp: 30 tablet, Rfl: 3   Medications ordered in this encounter:  No orders of the defined types were placed in this encounter.    *If you need refills on other medications prior to your next appointment, please contact your pharmacy*  Follow-Up: Call back or seek an in-person evaluation if the symptoms worsen or if the condition fails to improve as anticipated.  Other Instructions Please take antibiotic as directed.  Increase fluid intake.  Use Saline nasal spray.  Take a daily multivitamin. Take the steroid taper as directed as well. You can use Tylenol OTC for headache along with this if  needed. Ok to continue your allergy regimen.  Place a humidifier in the bedroom.  Please call or return clinic if symptoms are not improving.  Sinusitis Sinusitis is redness, soreness, and swelling (inflammation) of the paranasal sinuses. Paranasal sinuses are air pockets within the bones of your face (beneath the eyes, the middle of the forehead, or above the eyes). In healthy paranasal sinuses, mucus is able to drain out, and air is able to circulate through them by way of your nose. However, when your paranasal sinuses are inflamed, mucus and air can become trapped. This can allow bacteria and other germs to grow and cause infection. Sinusitis can develop quickly and last only a short time (acute) or continue over a long period (chronic). Sinusitis that lasts for more than 12 weeks is considered chronic.  CAUSES  Causes of sinusitis include: Allergies. Structural abnormalities, such as displacement of the cartilage that separates your nostrils (deviated septum), which can decrease the air flow through your nose and sinuses and affect sinus drainage. Functional abnormalities, such as when the small hairs (cilia) that line your sinuses and help remove mucus do not work properly or are not present. SYMPTOMS  Symptoms of acute and chronic sinusitis are the same. The primary symptoms are pain and pressure around the affected sinuses. Other symptoms include: Upper toothache. Earache. Headache. Bad breath. Decreased sense of smell and taste. A cough, which worsens when you are lying flat. Fatigue. Fever. Thick drainage from your nose, which often is green and may contain pus (purulent). Swelling and  warmth over the affected sinuses. DIAGNOSIS  Your caregiver will perform a physical exam. During the exam, your caregiver may: Look in your nose for signs of abnormal growths in your nostrils (nasal polyps). Tap over the affected sinus to check for signs of infection. View the inside of your sinuses  (endoscopy) with a special imaging device with a light attached (endoscope), which is inserted into your sinuses. If your caregiver suspects that you have chronic sinusitis, one or more of the following tests may be recommended: Allergy tests. Nasal culture A sample of mucus is taken from your nose and sent to a lab and screened for bacteria. Nasal cytology A sample of mucus is taken from your nose and examined by your caregiver to determine if your sinusitis is related to an allergy. TREATMENT  Most cases of acute sinusitis are related to a viral infection and will resolve on their own within 10 days. Sometimes medicines are prescribed to help relieve symptoms (pain medicine, decongestants, nasal steroid sprays, or saline sprays).  However, for sinusitis related to a bacterial infection, your caregiver will prescribe antibiotic medicines. These are medicines that will help kill the bacteria causing the infection.  Rarely, sinusitis is caused by a fungal infection. In theses cases, your caregiver will prescribe antifungal medicine. For some cases of chronic sinusitis, surgery is needed. Generally, these are cases in which sinusitis recurs more than 3 times per year, despite other treatments. HOME CARE INSTRUCTIONS  Drink plenty of water. Water helps thin the mucus so your sinuses can drain more easily. Use a humidifier. Inhale steam 3 to 4 times a day (for example, sit in the bathroom with the shower running). Apply a warm, moist washcloth to your face 3 to 4 times a day, or as directed by your caregiver. Use saline nasal sprays to help moisten and clean your sinuses. Take over-the-counter or prescription medicines for pain, discomfort, or fever only as directed by your caregiver. SEEK IMMEDIATE MEDICAL CARE IF: You have increasing pain or severe headaches. You have nausea, vomiting, or drowsiness. You have swelling around your face. You have vision problems. You have a stiff neck. You have  difficulty breathing. MAKE SURE YOU:  Understand these instructions. Will watch your condition. Will get help right away if you are not doing well or get worse. Document Released: 01/01/2005 Document Revised: 03/26/2011 Document Reviewed: 01/16/2011 [facility] Patient Information 2014 [place], [place].    If you have been instructed to have an in-person evaluation today at a local Urgent Care facility, please use the link below. It will take you to a list of all of our available [facility], including address, phone number and hours of operation. Please do not delay care.  [facility]  If you or a family member do not have a primary care provider, use the link below to schedule a visit and establish care. When you choose a [facility] primary care physician or advanced practice provider, you gain a long-term partner in health. Find a Primary Care Provider  Learn more about [facility] in-office and virtual care options:  - Get Care Now

## 2020-11-09 NOTE — Progress Notes (Signed)
Virtual Visit Consent   Kristin Little, you are scheduled for a virtual visit with a [facility] provider today.     Just as with appointments in the office, your consent must be obtained to participate.  Your consent will be active for this visit and any virtual visit you may have with one of our providers in the next 365 days.     If you have a MyChart account, a copy of this consent can be sent to you electronically.  All virtual visits are billed to your insurance company just like a traditional visit in the office.    As this is a virtual visit, video technology does not allow for your provider to perform a traditional examination.  This may limit your provider's ability to fully assess your condition.  If your provider identifies any concerns that need to be evaluated in person or the need to arrange testing (such as labs, EKG, etc.), we will make arrangements to do so.     Although advances in technology are sophisticated, we cannot ensure that it will always work on either your end or our end.  If the connection with a video visit is poor, the visit may have to be switched to a telephone visit.  With either a video or telephone visit, we are not always able to ensure that we have a secure connection.     I need to obtain your verbal consent now.   Are you willing to proceed with your visit today?    Kristin Little has provided verbal consent on 11/09/2020 for a virtual visit (video or telephone).   Piedad Climes, [place]   Date: 11/09/2020 10:05 AM   Virtual Visit via Video Note   I, Piedad Climes, connected with  Kristin Little  ([id number], 1960-04-01) on 11/09/20 at 10:00 AM EDT by a video-enabled telemedicine application and verified that I am speaking with the correct person using two identifiers.  Location: Patient: Virtual Visit Location Patient: Home Provider: Virtual Visit Location Provider: Home Office   I discussed the limitations of evaluation and  management by telemedicine and the availability of in person appointments. The patient expressed understanding and agreed to proceed.    History of Present Illness: Kristin Little is a 60 y.o. who identifies as a female who was assigned female at birth, and is being seen today for some ongoing URI symptoms. Endorses symptoms starting the past Sunday. Prior to that was having allergy symptoms -- rhinorrhea, nasal congestion, watery eyes. Since Sunday noting substantial sinus pressure, sinus headache and facial pain. OTC medications ease the pain but do not resolve it. Has been taking Mucinex and Sudafed during the day. Also using her nasal spray. Symptoms are more significant at night time.    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/04/2015   External hemorrhoid 05/04/2015   Hemorrhagic cystitis 02/28/2015   Menopause 10/21/2014    Allergies: No Known Allergies Medications:  Current Outpatient Medications:    amoxicillin-clavulanate (AUGMENTIN) 875-125 MG tablet, Take 1 tablet by mouth 2 (two) times daily., Disp: 14 tablet, Rfl: 0   predniSONE (STERAPRED UNI-PAK 21 TAB) 10 MG (21) TBPK tablet, Take following package directions., Disp: 21 tablet, Rfl: 0   Calcium-Magnesium-Vitamin D (CALCIUM 1200+D3 PO), ,  Disp: , Rfl:    MIMVEY 1-0.5 MG tablet, TAKE 1 TABLET BY MOUTH DAILY, Disp: 84 tablet, Rfl: 3  Observations/Objective: Patient is well-developed, well-nourished in no acute distress.  Resting comfortably at home.  Head is normocephalic, atraumatic.  No labored breathing. Speech is clear and coherent with logical content.  Patient is alert and oriented at baseline.   Assessment and Plan: 1. Acute non-recurrent frontal sinusitis - amoxicillin-clavulanate (AUGMENTIN) 875-125 MG  tablet; Take 1 tablet by mouth 2 (two) times daily.  Dispense: 14 tablet; Refill: 0 - predniSONE (STERAPRED UNI-PAK 21 TAB) 10 MG (21) TBPK tablet; Take following package directions.  Dispense: 21 tablet; Refill: 0 Will start Augmentin for suspected bacterial sinusitis giving abrupt change in her sinus symptoms. Continue allergy medication regimen. Will add on prednisone to help with inflammation and headache as she has required this in the past. Will need in-person assessment for any non-resolving symptoms.   Follow Up Instructions: I discussed the assessment and treatment plan with the patient. The patient was provided an opportunity to ask questions and all were answered. The patient agreed with the plan and demonstrated an understanding of the instructions.  A copy of instructions were sent to the patient via MyChart unless otherwise noted below.   The patient was advised to call back or seek an in-person evaluation if the symptoms worsen or if the condition fails to improve as anticipated.  Time:  I spent 12 minutes with the patient via telehealth technology discussing the above problems/concerns.    Piedad Climes, PA-C

## 2020-11-24 DIAGNOSIS — D2262 Melanocytic nevi of left upper limb, including shoulder: Secondary | ICD-10-CM | POA: Diagnosis not present

## 2020-11-24 DIAGNOSIS — D2261 Melanocytic nevi of right upper limb, including shoulder: Secondary | ICD-10-CM | POA: Diagnosis not present

## 2020-11-24 DIAGNOSIS — Z85828 Personal history of other malignant neoplasm of skin: Secondary | ICD-10-CM | POA: Diagnosis not present

## 2020-11-24 DIAGNOSIS — D225 Melanocytic nevi of trunk: Secondary | ICD-10-CM | POA: Diagnosis not present

## 2020-12-23 ENCOUNTER — Telehealth: Payer: Self-pay | Admitting: Obstetrics and Gynecology

## 2020-12-23 NOTE — Telephone Encounter (Signed)
Called patient. Advised patient to try Monistat 7 for 7 days. If no relief call back and we will send Diflucan.

## 2020-12-23 NOTE — Telephone Encounter (Signed)
Pt called stating that she thinks she has a yeast infection, pt is having discharge and itching. Symptoms present for over week, symptoms worsening. Please Advise.

## 2021-07-12 ENCOUNTER — Other Ambulatory Visit: Payer: Self-pay | Admitting: Obstetrics and Gynecology

## 2021-07-12 DIAGNOSIS — Z79890 Hormone replacement therapy (postmenopausal): Secondary | ICD-10-CM

## 2021-07-12 DIAGNOSIS — N951 Menopausal and female climacteric states: Secondary | ICD-10-CM

## 2021-08-16 ENCOUNTER — Other Ambulatory Visit: Payer: Self-pay | Admitting: Obstetrics and Gynecology

## 2021-08-23 ENCOUNTER — Telehealth: Payer: BC Managed Care – PPO | Admitting: Physician Assistant

## 2021-08-23 ENCOUNTER — Encounter: Payer: Self-pay | Admitting: Physician Assistant

## 2021-08-23 DIAGNOSIS — L237 Allergic contact dermatitis due to plants, except food: Secondary | ICD-10-CM | POA: Diagnosis not present

## 2021-08-23 MED ORDER — PREDNISONE 10 MG PO TABS: ORAL_TABLET | ORAL | 0 refills | Status: DC

## 2021-08-23 NOTE — Progress Notes (Signed)
E-Visit for Poison Ivy  We are sorry that you are not feeing well.  Here is how we plan to help!  Based on what you have shared with me it looks like you have had an allergic reaction to the oily resin from a group of plants.  This resin is very sticky, so it easily attaches to your skin, clothing, tools equipment, and pet's fur.    This blistering rash is often called poison ivy rash although it can come from contact with the leaves, stems and roots of poison ivy, poison oak and poison sumac.  The oily resin contains urushiol (u-ROO-she-ol) that produces a skin rash on exposed skin.  The severity of the rash depends on the amount of urushiol that gets on your skin.  A section of skin with more urushiol on it may develop a rash sooner.  The rash usually develops 12-48 hours after exposure and can last two to three weeks.  Your skin must come in direct contact with the plant's oil to be affected.  Blister fluid doesn't spread the rash.  However, if you come into contact with a piece of clothing or pet fur that has urushiol on it, the rash may spread out.  You can also transfer the oil to other parts of your body with your fingers.  Often the rash looks like a straight line because of the way the plant brushes against your skin.  Since your rash is widespread or has resulted in a large number of blisters, I have prescribed an oral corticosteroid.  Please follow these recommendations:  I have sent a prednisone dose pack to your chosen pharmacy. Be sure to follow the instructions carefully and complete the entire prescription. You may use Benadryl or Caladryl topical lotions to sooth the itch and remember cool, not hot, showers and baths can help relieve the itching!  Place cool, wet compresses on the affected area for 15-30 minutes several times a day.  You may also take oral antihistamines, such as diphenhydramine (Benadryl, others), which may also help you sleep better.  Watch your skin for any purulent  (pus) drainage or red streaking from the site.  If this occurs, contact your provider.  You may require an antibiotic for a skin infection.  Make sure that the clothes you were wearing as well as any towels or sheets that may have come in contact with the oil (urushiol) are washed in detergent and hot water.       I have developed the following plan to treat your condition I am prescribing a two week course of steroids (37 tablets of 10 mg prednisone).  Days 1-4 take 4 tablets (40 mg) daily  Days 5-8 take 3 tablets (30 mg) daily, Days 9-11 take 2 tablets (20 mg) daily, Days 12-14 take 1 tablet (10 mg) daily.    What can you do to prevent this rash?  Avoid the plants.  Learn how to identify poison ivy, poison oak and poison sumac in all seasons.  When hiking or engaging in other activities that might expose you to these plants, try to stay on cleared pathways.  If camping, make sure you pitch your tent in an area free of these plants.  Keep pets from running through wooded areas so that urushiol doesn't accidentally stick to their fur, which you may touch.  Remove or kill the plants.  In your yard, you can get rid of poison ivy by applying an herbicide or pulling it out of   the ground, including the roots, while wearing heavy gloves.  Afterward remove the gloves and thoroughly wash them and your hands.  Don't burn poison ivy or related plants because the urushiol can be carried by smoke.  Wear protective clothing.  If needed, protect your skin by wearing socks, boots, pants, long sleeves and vinyl gloves.  Wash your skin right away.  Washing off the oil with soap and water within 30 minutes of exposure may reduce your chances of getting a poison ivy rash.  Even washing after an hour or so can help reduce the severity of the rash.  If you walk through some poison ivy and then later touch your shoes, you may get some urushiol on your hands, which may then transfer to your face or body by touching or  rubbing.  If the contaminated object isn't cleaned, the urushiol on it can still cause a skin reaction years later.    Be careful not to reuse towels after you have washed your skin.  Also carefully wash clothing in detergent and hot water to remove all traces of the oil.  Handle contaminated clothing carefully so you don't transfer the urushiol to yourself, furniture, rugs or appliances.  Remember that pets can carry the oil on their fur and paws.  If you think your pet may be contaminated with urushiol, put on some long rubber gloves and give your pet a bath.  Finally, be careful not to burn these plants as the smoke can contain traces of the oil.  Inhaling the smoke may result in difficulty breathing. If that occurred you should see a physician as soon as possible.  See your doctor right away if:  The reaction is severe or widespread You inhaled the smoke from burning poison ivy and are having difficulty breathing Your skin continues to swell The rash affects your eyes, mouth or genitals Blisters are oozing pus You develop a fever greater than 100 F (37.8 C) The rash doesn't get better within a few weeks.  If you scratch the poison ivy rash, bacteria under your fingernails may cause the skin to become infected.  See your doctor if pus starts oozing from the blisters.  Treatment generally includes antibiotics.  Poison ivy treatments are usually limited to self-care methods.  And the rash typically goes away on its own in two to three weeks.     If the rash is widespread or results in a large number of blisters, your doctor may prescribe an oral corticosteroid, such as prednisone.  If a bacterial infection has developed at the rash site, your doctor may give you a prescription for an oral antibiotic.  MAKE SURE YOU  Understand these instructions. Will watch your condition. Will get help right away if you are not doing well or get worse.   Thank you for choosing an e-visit.  Your  e-visit answers were reviewed by a board certified advanced clinical practitioner to complete your personal care plan. Depending upon the condition, your plan could have included both over the counter or prescription medications.  Please review your pharmacy choice. Make sure the pharmacy is open so you can pick up prescription now. If there is a problem, you may contact your provider through MyChart messaging and have the prescription routed to another pharmacy.  Your safety is important to us. If you have drug allergies check your prescription carefully.   For the next 24 hours you can use MyChart to ask questions about today's visit, request a non-urgent   call back, or ask for a work or school excuse. You will get an email in the next two days asking about your experience. I hope that your e-visit has been valuable and will speed your recovery.   I provided 5 minutes of non face-to-face time during this encounter for chart review and documentation.   

## 2021-08-29 ENCOUNTER — Other Ambulatory Visit (HOSPITAL_COMMUNITY)
Admission: RE | Admit: 2021-08-29 | Discharge: 2021-08-29 | Disposition: A | Discharge status: Home / Self Care | Payer: BC Managed Care – PPO | Source: Ambulatory Visit | Attending: Obstetrics and Gynecology | Admitting: Obstetrics and Gynecology

## 2021-08-29 ENCOUNTER — Encounter: Payer: Self-pay | Admitting: Obstetrics and Gynecology

## 2021-08-29 ENCOUNTER — Ambulatory Visit (INDEPENDENT_AMBULATORY_CARE_PROVIDER_SITE_OTHER): Payer: BC Managed Care – PPO | Admitting: Obstetrics and Gynecology

## 2021-08-29 VITALS — BP 123/81 | HR 67 | Ht 66.0 in | Wt 144.4 lb

## 2021-08-29 DIAGNOSIS — Z79890 Hormone replacement therapy (postmenopausal): Secondary | ICD-10-CM | POA: Diagnosis not present

## 2021-08-29 DIAGNOSIS — N760 Acute vaginitis: Secondary | ICD-10-CM | POA: Insufficient documentation

## 2021-08-29 DIAGNOSIS — Z01419 Encounter for gynecological examination (general) (routine) without abnormal findings: Secondary | ICD-10-CM | POA: Diagnosis not present

## 2021-08-29 DIAGNOSIS — N898 Other specified noninflammatory disorders of vagina: Secondary | ICD-10-CM | POA: Diagnosis not present

## 2021-08-29 DIAGNOSIS — Z1231 Encounter for screening mammogram for malignant neoplasm of breast: Secondary | ICD-10-CM

## 2021-08-29 NOTE — Progress Notes (Addendum)
HPI:      Kristin Little is a 61 y.o. G1P1001 who LMP was No LMP recorded. Patient is postmenopausal.  Subjective:   She presents today for her annual examination.  She complains of extreme discomfort with vaginal intercourse.  She states that immediately upon insertion it hurts.  She says that several years ago she tried estrogen vaginal cream without success reporting that it was messy and she did not like using it. When questioned she does report an increase in vaginal discharge with some vulvar irritation occasionally.    Hx: The following portions of the patient's history were reviewed and updated as appropriate:             She  has a past medical history of Abnormal uterine bleeding (AUB), Anxiety, Arthritis, Chronic fatigue, Environmental allergies, Insomnia, Perimenopausal, Shoulder pain, and Squamous cell carcinoma. She does not have any pertinent problems on file. She  has a past surgical history that includes Tonsillectomy; Nasal sinus surgery; Dilation and curettage of uterus; Endometrial ablation; skin cancer removed; Colonoscopy with propofol (N/A, 07/04/2015); and Augmentation mammaplasty (2012). Her family history includes Breast cancer (age of onset: 66) in her mother; Endometriosis in her sister; Hypertension in her father; Hypothyroidism in her father; Seizures in her brother. She  reports that she has never smoked. She has never used smokeless tobacco. She reports current alcohol use. She reports that she does not use drugs. She has a current medication list which includes the following prescription(s): calcium-magnesium-vitamin d, cetirizine, mimvey, and prednisone. She has No Known Allergies.       Review of Systems:  Review of Systems  Constitutional: Denied constitutional symptoms, night sweats, recent illness, fatigue, fever, insomnia and weight loss.  Eyes: Denied eye symptoms, eye pain, photophobia, vision change and visual disturbance.  Ears/Nose/Throat/Neck:  Denied ear, nose, throat or neck symptoms, hearing loss, nasal discharge, sinus congestion and sore throat.  Cardiovascular: Denied cardiovascular symptoms, arrhythmia, chest pain/pressure, edema, exercise intolerance, orthopnea and palpitations.  Respiratory: Denied pulmonary symptoms, asthma, pleuritic pain, productive sputum, cough, dyspnea and wheezing.  Gastrointestinal: Denied, gastro-esophageal reflux, melena, nausea and vomiting.  Genitourinary: See HPI for additional information.  Musculoskeletal: Denied musculoskeletal symptoms, stiffness, swelling, muscle weakness and myalgia.  Dermatologic: Denied dermatology symptoms, rash and scar.  Neurologic: Denied neurology symptoms, dizziness, headache, neck pain and syncope.  Psychiatric: Denied psychiatric symptoms, anxiety and depression.  Endocrine: Denied endocrine symptoms including hot flashes and night sweats.   Meds:   Current Outpatient Medications on File Prior to Visit  Medication Sig Dispense Refill   Calcium-Magnesium-Vitamin D (CALCIUM 1200+D3 PO)      cetirizine (ZYRTEC) 10 MG tablet Take 10 mg by mouth daily.     MIMVEY 1-0.5 MG tablet TAKE 1 TABLET BY MOUTH DAILY 84 tablet 0   predniSONE (DELTASONE) 10 MG tablet Days 1-4 take 4 tablets (40 mg) daily  Days 5-8 take 3 tablets (30 mg) daily, Days 9-11 take 2 tablets (20 mg) daily, Days 12-14 take 1 tablet (10 mg) daily. 37 tablet 0   No current facility-administered medications on file prior to visit.     Objective:     Vitals:   08/29/21 0805  BP: 123/81  Pulse: 67    Filed Weights   08/29/21 0805  Weight: 144 lb 6.4 oz (65.5 kg)              Physical examination General NAD, Conversant  HEENT Atraumatic; Op clear with mmm.  Normo-cephalic. Pupils reactive. Anicteric  sclerae  Thyroid/Neck Smooth without nodularity or enlargement. Normal ROM.  Neck Supple.  Skin No rashes, lesions or ulceration. Normal palpated skin turgor. No nodularity.  Breasts: No masses  or discharge.  Symmetric.  No axillary adenopathy.  Bilateral implants  Lungs: Clear to auscultation.No rales or wheezes. Normal Respiratory effort, no retractions.  Heart: NSR.  No murmurs or rubs appreciated. No periferal edema  Abdomen: Soft.  Non-tender.  No masses.  No HSM. No hernia  Extremities: Moves all appropriately.  Normal ROM for age. No lymphadenopathy.  Neuro: Oriented to PPT.  Normal mood. Normal affect.     Pelvic:   Vulva: Normal appearance.  No lesions.  Vagina: Increased yellowish vaginal discharge noted.  Vaginal walls somewhat erythematous.  Does not appear atrophic.  Support: Normal pelvic support.  Urethra No masses tenderness or scarring.  Meatus Normal size without lesions or prolapse.  Cervix: Normal appearance.  No lesions.  Anus: Normal exam.  No lesions.  Perineum: Normal exam.  No lesions.        Bimanual   Uterus: Normal size.  Non-tender.  Mobile.  AV.  Adnexae: No masses.  Non-tender to palpation.  Cul-de-sac: Negative for abnormality.     Assessment:    G1P1001 Patient Active Problem List   Diagnosis Date Noted   Dyspareunia, female 05/29/2017   Vaginal atrophy 05/29/2017   Insomnia 05/15/2016   Special screening for malignant neoplasms, colon    Status post endometrial ablation 05/04/2015   External hemorrhoid 05/04/2015   Hemorrhagic cystitis 02/28/2015   Menopause 10/21/2014     1. Well woman exam with routine gynecological exam   2. Screening mammogram for breast cancer   3. Postmenopausal hormone therapy   4. Vaginal discharge     I believe it may be the vaginal discharge that is the source of her pain with intercourse.  If everything proves negative consider estrogen vaginal cream for 1 month.   Plan:            1.  Basic Screening Recommendations The basic screening recommendations for asymptomatic women were discussed with the patient during her visit.  The age-appropriate recommendations were discussed with her and the  rational for the tests reviewed.  When I am informed by the patient that another primary care physician has previously obtained the age-appropriate tests and they are up-to-date, only outstanding tests are ordered and referrals given as necessary.  Abnormal results of tests will be discussed with her when all of her results are completed.  Routine preventative health maintenance measures emphasized: Exercise/Diet/Weight control, Tobacco Warnings, Alcohol/Substance use risks and Stress Management  Mammogram ordered -patient will return for blood work  2.  NuSwab performed.  We will contact her with results. Orders Orders Placed This Encounter  Procedures   MM 3D SCREEN BREAST W/IMPLANT BILATERAL   Basic metabolic panel   CBC   Lipid panel   Hemoglobin A1c   TSH    No orders of the defined types were placed in this encounter.         F/U  Return in about 1 year (around 08/30/2022) for Annual Physical, We will contact her with any abnormal test results.  Elonda Husky, M.D. 08/29/2021 8:37 AM

## 2021-08-29 NOTE — Progress Notes (Signed)
Patients presents for annual exam today. She states daily use of HRT, experiencing vaginal dryness and discomfort with intercourse . Patient is up to date on pap smear. Due for mammogram, ordered. Annual labs are ordered, will be completed when fasting. Patient states no other questions or concerns at this time.

## 2021-08-29 NOTE — Addendum Note (Signed)
Addended by: Georgiana Shore R on: 08/29/2021 09:10 AM   Modules accepted: Orders

## 2021-08-30 ENCOUNTER — Other Ambulatory Visit: Payer: Self-pay

## 2021-08-30 DIAGNOSIS — B9689 Other specified bacterial agents as the cause of diseases classified elsewhere: Secondary | ICD-10-CM

## 2021-08-30 LAB — CERVICOVAGINAL ANCILLARY ONLY
Bacterial Vaginitis (gardnerella): POSITIVE — AB
Candida Glabrata: NEGATIVE
Candida Vaginitis: NEGATIVE
Chlamydia: NEGATIVE
Comment: NEGATIVE
Comment: NEGATIVE
Comment: NEGATIVE
Comment: NEGATIVE
Comment: NEGATIVE
Comment: NORMAL
Neisseria Gonorrhea: NEGATIVE
Trichomonas: NEGATIVE

## 2021-08-30 MED ORDER — METRONIDAZOLE 500 MG PO TABS: 500.0000 mg | ORAL_TABLET | Freq: Two times a day (BID) | ORAL | 0 refills | Status: DC

## 2021-08-30 NOTE — Progress Notes (Signed)
Per protocol for BV.

## 2021-08-31 ENCOUNTER — Encounter: Payer: Self-pay | Admitting: Obstetrics and Gynecology

## 2021-09-21 ENCOUNTER — Telehealth: Payer: BC Managed Care – PPO | Admitting: Family Medicine

## 2021-09-21 DIAGNOSIS — J019 Acute sinusitis, unspecified: Secondary | ICD-10-CM | POA: Diagnosis not present

## 2021-09-21 DIAGNOSIS — B9689 Other specified bacterial agents as the cause of diseases classified elsewhere: Secondary | ICD-10-CM

## 2021-09-21 MED ORDER — AMOXICILLIN-POT CLAVULANATE 875-125 MG PO TABS: 1.0000 | ORAL_TABLET | Freq: Two times a day (BID) | ORAL | 0 refills | Status: AC

## 2021-09-21 NOTE — Progress Notes (Signed)

## 2021-09-28 ENCOUNTER — Other Ambulatory Visit: Payer: Self-pay | Admitting: Obstetrics and Gynecology

## 2021-09-28 DIAGNOSIS — Z79890 Hormone replacement therapy (postmenopausal): Secondary | ICD-10-CM

## 2021-09-28 DIAGNOSIS — N951 Menopausal and female climacteric states: Secondary | ICD-10-CM

## 2021-10-02 MED ORDER — ESTRADIOL-NORETHINDRONE ACET 1-0.5 MG PO TABS: 1.0000 | ORAL_TABLET | Freq: Every day | ORAL | 0 refills | Status: DC

## 2021-10-23 ENCOUNTER — Ambulatory Visit
Admission: RE | Admit: 2021-10-23 | Discharge: 2021-10-23 | Disposition: A | Discharge status: Home / Self Care | Payer: BC Managed Care – PPO | Source: Ambulatory Visit | Attending: Obstetrics and Gynecology | Admitting: Obstetrics and Gynecology

## 2021-10-23 DIAGNOSIS — Z01419 Encounter for gynecological examination (general) (routine) without abnormal findings: Secondary | ICD-10-CM | POA: Diagnosis not present

## 2021-10-23 DIAGNOSIS — Z1231 Encounter for screening mammogram for malignant neoplasm of breast: Secondary | ICD-10-CM | POA: Diagnosis not present

## 2021-11-20 ENCOUNTER — Encounter: Payer: Self-pay | Admitting: Obstetrics and Gynecology

## 2021-11-27 DIAGNOSIS — X32XXXA Exposure to sunlight, initial encounter: Secondary | ICD-10-CM | POA: Diagnosis not present

## 2021-11-27 DIAGNOSIS — L538 Other specified erythematous conditions: Secondary | ICD-10-CM | POA: Diagnosis not present

## 2021-11-27 DIAGNOSIS — L820 Inflamed seborrheic keratosis: Secondary | ICD-10-CM | POA: Diagnosis not present

## 2021-11-27 DIAGNOSIS — L570 Actinic keratosis: Secondary | ICD-10-CM | POA: Diagnosis not present

## 2021-11-27 DIAGNOSIS — D2262 Melanocytic nevi of left upper limb, including shoulder: Secondary | ICD-10-CM | POA: Diagnosis not present

## 2021-11-27 DIAGNOSIS — Z85828 Personal history of other malignant neoplasm of skin: Secondary | ICD-10-CM | POA: Diagnosis not present

## 2021-11-27 DIAGNOSIS — D2272 Melanocytic nevi of left lower limb, including hip: Secondary | ICD-10-CM | POA: Diagnosis not present

## 2021-11-27 DIAGNOSIS — D2261 Melanocytic nevi of right upper limb, including shoulder: Secondary | ICD-10-CM | POA: Diagnosis not present

## 2021-12-16 ENCOUNTER — Other Ambulatory Visit: Payer: Self-pay | Admitting: Obstetrics and Gynecology

## 2021-12-16 DIAGNOSIS — N951 Menopausal and female climacteric states: Secondary | ICD-10-CM

## 2021-12-16 DIAGNOSIS — Z79890 Hormone replacement therapy (postmenopausal): Secondary | ICD-10-CM

## 2021-12-22 ENCOUNTER — Other Ambulatory Visit (HOSPITAL_COMMUNITY)
Admission: RE | Admit: 2021-12-22 | Discharge: 2021-12-22 | Disposition: A | Discharge status: Home / Self Care | Payer: BC Managed Care – PPO | Source: Ambulatory Visit | Attending: Obstetrics and Gynecology | Admitting: Obstetrics and Gynecology

## 2021-12-22 ENCOUNTER — Ambulatory Visit (INDEPENDENT_AMBULATORY_CARE_PROVIDER_SITE_OTHER): Payer: BC Managed Care – PPO

## 2021-12-22 VITALS — BP 125/79 | HR 67 | Resp 16 | Ht 65.0 in | Wt 137.8 lb

## 2021-12-22 DIAGNOSIS — N898 Other specified noninflammatory disorders of vagina: Secondary | ICD-10-CM

## 2021-12-22 NOTE — Patient Instructions (Signed)
Bacterial Vaginosis  Bacterial vaginosis is an infection of the vagina. It happens when too many normal germs (healthy bacteria) grow in the vagina. This infection can make it easier to get other infections from sex (STIs). It is very important for pregnant women to get treated. This infection can cause babies to be born early or at a low birth weight. What are the causes? This infection is caused by an increase in certain germs that grow in the vagina. You cannot get this infection from toilet seats, bedsheets, swimming pools, or things that touch your vagina. What increases the risk? Having sex with a new person or more than one person. Having sex without protection. Douching. Having an intrauterine device (IUD). Smoking. Using drugs or drinking alcohol. These can lead you to do things that are risky. Taking certain antibiotic medicines. Being pregnant. What are the signs or symptoms? Some women have no symptoms. Symptoms may include: A discharge from your vagina. It may be gray or white. It can be watery or foamy. A fishy smell. This can happen after sex or during your menstrual period. Itching in and around your vagina. A feeling of burning or pain when you pee (urinate). How is this treated? This infection is treated with antibiotic medicines. These may be given to you as: A pill. A cream for your vagina. A medicine that you put into your vagina (suppository). If the infection comes back after treatment, you may need more antibiotics. Follow these instructions at home: Medicines Take over-the-counter and prescription medicines as told by your doctor. Take or use your antibiotic medicine as told by your doctor. Do not stop taking or using it, even if you start to feel better. General instructions If the person you have sex with is a woman, tell her that you have this infection. She will need to follow up with her doctor. If you have a female partner, he does not need to be  treated. Do not have sex until you finish treatment. Drink enough fluid to keep your pee pale yellow. Keep your vagina and butt clean. Wash the area with warm water each day. Wipe from front to back after you use the toilet. If you are breastfeeding a baby, ask your doctor if you should keep doing so during treatment. Keep all follow-up visits. How is this prevented? Self-care Do not douche. Use only warm water to wash around your vagina. Wear underwear that is cotton or lined with cotton. Do not wear tight pants and pantyhose, especially in the summer. Safe sex Use protection when you have sex. This includes: Use condoms. Use dental dams. This is a thin layer that protects the mouth during oral sex. Limit how many people you have sex with. To prevent this infection, it is best to have sex with just one person. Get tested for STIs. The person you have sex with should also get tested. Drugs and alcohol Do not smoke or use any products that contain nicotine or tobacco. If you need help quitting, ask your doctor. Do not use drugs. Do not drink alcohol if: Your doctor tells you not to drink. You are pregnant, may be pregnant, or are planning to become pregnant. If you drink alcohol: Limit how much you have to 0-1 drink a day. Know how much alcohol is in your drink. In the [place] one drink equals one 12 oz bottle of beer (355 mL), one 5 oz glass of wine (148 mL), or one 1 oz glass of hard liquor (44   mL). Where to find more information Centers for Disease Control and Prevention: FootballExhibition.com.br American Sexual Health Association: www.ashastd.org Office on [employer] Health: http://hoffman.com/ Contact a doctor if: Your symptoms do not get better, even after you are treated. You have more discharge or pain when you pee. You have a fever or chills. You have pain in your belly (abdomen) or in the area between your hips. You have pain with sex. You bleed from your vagina between menstrual  periods. Summary This infection can happen when too many germs (bacteria) grow in the vagina. This infection can make it easier to get infections from sex (STIs). Treating this can lower that chance. Get treated if you are pregnant. This infection can cause babies to be born early. Do not stop taking or using your antibiotic medicine, even if you start to feel better. This information is not intended to replace advice given to you by your health care provider. Make sure you discuss any questions you have with your health care provider. Document Revised: 07/02/2019 Document Reviewed: 07/02/2019 Elsevier Patient Education  2023 Elsevier Inc. Vaginal Yeast Infection, Adult  Vaginal yeast infection is a condition that causes vaginal discharge as well as soreness, swelling, and redness (inflammation) of the vagina. This is a common condition. Some women get this infection frequently. What are the causes? This condition is caused by a change in the normal balance of the yeast (Candida) and normal bacteria that live in the vagina. This change causes an overgrowth of yeast, which causes the inflammation. What increases the risk? The condition is more likely to develop in women who: Take antibiotic medicines. Have diabetes. Take birth control pills. Are pregnant. Douche often. Have a weak body defense system (immune system). Have been taking steroid medicines for a long time. Frequently wear tight clothing. What are the signs or symptoms? Symptoms of this condition include: White, thick, creamy vaginal discharge. Swelling, itching, redness, and irritation of the vagina. The lips of the vagina (labia) may be affected as well. Pain or a burning feeling while urinating. Pain during sex. How is this diagnosed? This condition is diagnosed based on: Your medical history. A physical exam. A pelvic exam. Your health care provider will examine a sample of your vaginal discharge under a microscope.  Your health care provider may send this sample for testing to confirm the diagnosis. How is this treated? This condition is treated with medicine. Medicines may be over-the-counter or prescription. You may be told to use one or more of the following: Medicine that is taken by mouth (orally). Medicine that is applied as a cream (topically). Medicine that is inserted directly into the vagina (suppository). Follow these instructions at home: Take or apply over-the-counter and prescription medicines only as told by your health care provider. Do not use tampons until your health care provider approves. Do not have sex until your infection has cleared. Sex can prolong or worsen your symptoms of infection. Ask your health care provider when it is safe to resume sexual activity. Keep all follow-up visits. This is important. How is this prevented?  Do not wear tight clothes, such as pantyhose or tight pants. Wear breathable cotton underwear. Do not use douches, perfumed soap, creams, or powders. Wipe from front to back after using the toilet. If you have diabetes, keep your blood sugar levels under control. Ask your health care provider for other ways to prevent yeast infections. Contact a health care provider if: You have a fever. Your symptoms go away and then  return. Your symptoms do not get better with treatment. Your symptoms get worse. You have new symptoms. You develop blisters in or around your vagina. You have blood coming from your vagina and it is not your menstrual period. You develop pain in your abdomen. Summary Vaginal yeast infection is a condition that causes discharge as well as soreness, swelling, and redness (inflammation) of the vagina. This condition is treated with medicine. Medicines may be over-the-counter or prescription. Take or apply over-the-counter and prescription medicines only as told by your health care provider. Do not douche. Resume sexual activity or use of  tampons as instructed by your health care provider. Contact a health care provider if your symptoms do not get better with treatment or your symptoms go away and then return. This information is not intended to replace advice given to you by your health care provider. Make sure you discuss any questions you have with your health care provider. Document Revised: 03/21/2020 Document Reviewed: 03/21/2020 Elsevier Patient Education  [place].

## 2021-12-22 NOTE — Progress Notes (Signed)
    NURSE VISIT NOTE  Subjective:    Patient ID: Kristin Little, female    DOB: 1960-08-31, 61 y.o.   MRN: [id number]  HPI  Patient is a 61 y.o. G74P1001 female who presents for evaluation of vaginal discharge. She was evaluated on 08/29/2021 for annual. Discharge noticed at the time and she was diagnosed with bacterial vaginosis and treated with Flagyl. She reports same symptoms today with odor. Self swab obtained. Ph-d boric acid vaginal suppositories samples given. Told her results will come back through mychart.   The following portions of the patient's history were reviewed and updated as appropriate: allergies, current medications, past family history, past medical history, past social history, past surgical history, and problem list.  Review of Systems Pertinent items noted in HPI and remainder of comprehensive ROS otherwise negative.   Objective:   Blood pressure 125/79, pulse 67, resp. rate 16, height 5\' 5"  (1.651 m), weight 137 lb 12.8 oz (62.5 kg). Body mass index is 22.93 kg/m. General appearance: alert, cooperative, and no distress  Assessment:   1. Vaginal discharge      Plan:   1. Vaginal discharge     - Cervicovaginal ancillary only collected.     - PH-d samples given to patient.      , CMA [facility]

## 2021-12-25 ENCOUNTER — Other Ambulatory Visit: Payer: Self-pay

## 2021-12-25 DIAGNOSIS — B9689 Other specified bacterial agents as the cause of diseases classified elsewhere: Secondary | ICD-10-CM

## 2021-12-25 LAB — CERVICOVAGINAL ANCILLARY ONLY
Bacterial Vaginitis (gardnerella): POSITIVE — AB
Candida Glabrata: NEGATIVE
Candida Vaginitis: NEGATIVE
Comment: NEGATIVE
Comment: NEGATIVE
Comment: NEGATIVE
Comment: NEGATIVE
Trichomonas: NEGATIVE

## 2021-12-25 MED ORDER — METRONIDAZOLE 500 MG PO TABS: 500.0000 mg | ORAL_TABLET | Freq: Two times a day (BID) | ORAL | 0 refills | Status: DC

## 2021-12-28 ENCOUNTER — Telehealth: Payer: BC Managed Care – PPO | Admitting: Family Medicine

## 2021-12-28 DIAGNOSIS — J028 Acute pharyngitis due to other specified organisms: Secondary | ICD-10-CM

## 2021-12-29 NOTE — Progress Notes (Signed)
E-Visit for Sore Throat  We are sorry that you are not feeling well.  Here is how we plan to help!  Your symptoms indicate a likely viral infection (Pharyngitis).   Pharyngitis is inflammation in the back of the throat which can cause a sore throat, scratchiness and sometimes difficulty swallowing.   Pharyngitis is typically caused by a respiratory virus and will just run its course.  Please keep in mind that your symptoms could last up to 10 days.  For throat pain, we recommend over the counter oral pain relief medications such as acetaminophen or aspirin, or anti-inflammatory medications such as ibuprofen or naproxen sodium.  Topical treatments such as oral throat lozenges or sprays may be used as needed.  Avoid close contact with loved ones, especially the very young and elderly.  Remember to wash your hands thoroughly throughout the day as this is the number one way to prevent the spread of infection and wipe down door knobs and counters with disinfectant.  After careful review of your answers, I would not recommend an antibiotic for your condition.  Antibiotics should not be used to treat conditions that we suspect are caused by viruses like the virus that causes the common cold or flu. However, some people can have Strep with atypical symptoms. You may need formal testing in clinic or office to confirm if your symptoms continue or worsen.  Providers prescribe antibiotics to treat infections caused by bacteria. Antibiotics are very powerful in treating bacterial infections when they are used properly.  To maintain their effectiveness, they should be used only when necessary.  Overuse of antibiotics has resulted in the development of super bugs that are resistant to treatment!    Home Care: Only take medications as instructed by your medical team. Do not drink alcohol while taking these medications. A steam or ultrasonic humidifier can help congestion.  You can place a towel over your head and  breathe in the steam from hot water coming from a faucet. Avoid close contacts especially the very young and the elderly. Cover your mouth when you cough or sneeze. Always remember to wash your hands.  Get Help Right Away If: You develop worsening fever or throat pain. You develop a severe head ache or visual changes. Your symptoms persist after you have completed your treatment plan.  Make sure you Understand these instructions. Will watch your condition. Will get help right away if you are not doing well or get worse.   Thank you for choosing an e-visit.  Your e-visit answers were reviewed by a board certified advanced clinical practitioner to complete your personal care plan. Depending upon the condition, your plan could have included both over the counter or prescription medications.  Please review your pharmacy choice. Make sure the pharmacy is open so you can pick up prescription now. If there is a problem, you may contact your provider through MyChart messaging and have the prescription routed to another pharmacy.  Your safety is important to us. If you have drug allergies check your prescription carefully.   For the next 24 hours you can use MyChart to ask questions about today's visit, request a non-urgent call back, or ask for a work or school excuse. You will get an email in the next two days asking about your experience. I hope that your e-visit has been valuable and will speed your recovery.   I have provided 5 minutes of non face to face time during this encounter for chart review and documentation.   

## 2022-01-05 ENCOUNTER — Telehealth: Payer: BC Managed Care – PPO | Admitting: Emergency Medicine

## 2022-01-05 DIAGNOSIS — J019 Acute sinusitis, unspecified: Secondary | ICD-10-CM

## 2022-01-05 DIAGNOSIS — B9689 Other specified bacterial agents as the cause of diseases classified elsewhere: Secondary | ICD-10-CM | POA: Diagnosis not present

## 2022-01-05 MED ORDER — AMOXICILLIN-POT CLAVULANATE 875-125 MG PO TABS: 1.0000 | ORAL_TABLET | Freq: Two times a day (BID) | ORAL | 0 refills | Status: DC

## 2022-01-05 NOTE — Progress Notes (Signed)
E-Visit for Sinus Problems  We are sorry that you are not feeling well.  Here is how we plan to help!  Based on what you have shared with me it looks like you have sinusitis.  Sinusitis is inflammation and infection in the sinus cavities of the head.  Based on your presentation I believe you most likely have Acute Bacterial Sinusitis.  This is an infection caused by bacteria and is treated with antibiotics. I have prescribed Augmentin 875mg/125mg one tablet twice daily with food, for 7 days.   You may use an oral decongestant such as Mucinex D or if you have glaucoma or high blood pressure use plain Mucinex.   Saline nasal spray help and can safely be used as often as needed for congestion.  Try using saline irrigation, such as with a neti pot, several times a day while you are sick. Many neti pots come with salt packets premeasured to use to make saline. If you use your own salt, make sure it is kosher salt or sea salt (don't use table salt as it has iodine in it and you don't need that in your nose). Use distilled water to make saline. If you mix your own saline using your own salt, the recipe is 1/4 teaspoon salt in 1 cup warm water. Using saline irrigation can help prevent and treat sinus infections.   If you develop worsening sinus pain, fever or notice severe headache and vision changes, or if symptoms are not better after completion of antibiotic, please schedule an appointment with a health care provider.    Sinus infections are not as easily transmitted as other respiratory infection, however we still recommend that you avoid close contact with loved ones, especially the very young and elderly.  Remember to wash your hands thoroughly throughout the day as this is the number one way to prevent the spread of infection!  Home Care: Only take medications as instructed by your medical team. Complete the entire course of an antibiotic. Do not take these medications with alcohol. A steam or  ultrasonic humidifier can help congestion.  You can place a towel over your head and breathe in the steam from hot water coming from a faucet. Avoid close contacts especially the very young and the elderly. Cover your mouth when you cough or sneeze. Always remember to wash your hands.  Get Help Right Away If: You develop worsening fever or sinus pain. You develop a severe head ache or visual changes. Your symptoms persist after you have completed your treatment plan.  Make sure you Understand these instructions. Will watch your condition. Will get help right away if you are not doing well or get worse.  Thank you for choosing an e-visit.  Your e-visit answers were reviewed by a board certified advanced clinical practitioner to complete your personal care plan. Depending upon the condition, your plan could have included both over the counter or prescription medications.  Please review your pharmacy choice. Make sure the pharmacy is open so you can pick up prescription now. If there is a problem, you may contact your provider through MyChart messaging and have the prescription routed to another pharmacy.  Your safety is important to us. If you have drug allergies check your prescription carefully.   For the next 24 hours you can use MyChart to ask questions about today's visit, request a non-urgent call back, or ask for a work or school excuse. You will get an email in the next two days asking   about your experience. I hope that your e-visit has been valuable and will speed your recovery.  I have spent 5 minutes in review of e-visit questionnaire, review and updating patient chart, medical decision making and response to patient.   Lautaro Koral, PhD, FNP-BC   

## 2022-03-07 ENCOUNTER — Telehealth: Payer: BC Managed Care – PPO | Admitting: Physician Assistant

## 2022-03-07 DIAGNOSIS — B9689 Other specified bacterial agents as the cause of diseases classified elsewhere: Secondary | ICD-10-CM | POA: Diagnosis not present

## 2022-03-07 DIAGNOSIS — J019 Acute sinusitis, unspecified: Secondary | ICD-10-CM

## 2022-03-07 MED ORDER — AMOXICILLIN-POT CLAVULANATE 875-125 MG PO TABS: 1.0000 | ORAL_TABLET | Freq: Two times a day (BID) | ORAL | 0 refills | Status: DC

## 2022-03-07 MED ORDER — PROMETHAZINE-DM 6.25-15 MG/5ML PO SYRP: 5.0000 mL | ORAL_SOLUTION | Freq: Four times a day (QID) | ORAL | 0 refills | Status: DC | PRN

## 2022-03-07 NOTE — Progress Notes (Signed)
E-Visit for Sinus Problems  We are sorry that you are not feeling well.  Here is how we plan to help!  Based on what you have shared with me it looks like you have sinusitis.  Sinusitis is inflammation and infection in the sinus cavities of the head.  Based on your presentation I believe you most likely have Acute Bacterial Sinusitis.  This is an infection caused by bacteria and is treated with antibiotics. I have prescribed Augmentin 871m/125mg one tablet twice daily with food, for 7 days. I have also prescribed Promethazine DM for cough. You may use an oral decongestant such as Mucinex D or if you have glaucoma or high blood pressure use plain Mucinex. Saline nasal spray help and can safely be used as often as needed for congestion.  If you develop worsening sinus pain, fever or notice severe headache and vision changes, or if symptoms are not better after completion of antibiotic, please schedule an appointment with a health care provider.    Sinus infections are not as easily transmitted as other respiratory infection, however we still recommend that you avoid close contact with loved ones, especially the very young and elderly.  Remember to wash your hands thoroughly throughout the day as this is the number one way to prevent the spread of infection!  Home Care: Only take medications as instructed by your medical team. Complete the entire course of an antibiotic. Do not take these medications with alcohol. A steam or ultrasonic humidifier can help congestion.  You can place a towel over your head and breathe in the steam from hot water coming from a faucet. Avoid close contacts especially the very young and the elderly. Cover your mouth when you cough or sneeze. Always remember to wash your hands.  Get Help Right Away If: You develop worsening fever or sinus pain. You develop a severe head ache or visual changes. Your symptoms persist after you have completed your treatment plan.  Make  sure you Understand these instructions. Will watch your condition. Will get help right away if you are not doing well or get worse.  Thank you for choosing an e-visit.  Your e-visit answers were reviewed by a board certified advanced clinical practitioner to complete your personal care plan. Depending upon the condition, your plan could have included both over the counter or prescription medications.  Please review your pharmacy choice. Make sure the pharmacy is open so you can pick up prescription now. If there is a problem, you may contact your provider through [employer] have the prescription routed to another pharmacy.  Your safety is important to [place] If you have drug allergies check your prescription carefully.   For the next 24 hours you can use MyChart to ask questions about today's visit, request a non-urgent call back, or ask for a work or school excuse. You will get an email in the next two days asking about your experience. I hope that your e-visit has been valuable and will speed your recovery.  I have spent 5 minutes in review of e-visit questionnaire, review and updating patient chart, medical decision making and response to patient.   JMar Daring PA-C

## 2022-03-10 ENCOUNTER — Other Ambulatory Visit: Payer: Self-pay | Admitting: Obstetrics and Gynecology

## 2022-03-10 DIAGNOSIS — N951 Menopausal and female climacteric states: Secondary | ICD-10-CM

## 2022-03-10 DIAGNOSIS — Z79890 Hormone replacement therapy (postmenopausal): Secondary | ICD-10-CM

## 2022-03-11 ENCOUNTER — Encounter: Payer: Self-pay | Admitting: Obstetrics and Gynecology

## 2022-06-01 ENCOUNTER — Other Ambulatory Visit: Payer: Self-pay | Admitting: Obstetrics and Gynecology

## 2022-06-01 DIAGNOSIS — N951 Menopausal and female climacteric states: Secondary | ICD-10-CM

## 2022-06-01 DIAGNOSIS — Z79890 Hormone replacement therapy (postmenopausal): Secondary | ICD-10-CM

## 2022-08-21 ENCOUNTER — Other Ambulatory Visit: Payer: Self-pay

## 2022-08-21 DIAGNOSIS — N951 Menopausal and female climacteric states: Secondary | ICD-10-CM

## 2022-08-21 DIAGNOSIS — Z79890 Hormone replacement therapy (postmenopausal): Secondary | ICD-10-CM

## 2022-08-22 ENCOUNTER — Telehealth: Payer: Self-pay

## 2022-08-22 MED ORDER — ESTRADIOL-NORETHINDRONE ACET 1-0.5 MG PO TABS: 1.0000 | ORAL_TABLET | Freq: Every day | ORAL | 0 refills | Status: DC

## 2022-08-22 NOTE — Addendum Note (Signed)
Addended by: Donnetta Hail on: 08/22/2022 11:27 AM   Modules accepted: Orders

## 2022-08-22 NOTE — Telephone Encounter (Signed)
Pt called triage requesting RF on estradiol-norethindrone (ACTIVELLA) 1-0.5 MG tablet. Last annual 08/29/21, she tried to schedule annual and was advised by front desk Dr. Logan Bores schedule is not out yet and to call back at the end of this month to see if schedule is out. Do you approve 1 refill?

## 2022-08-22 NOTE — Telephone Encounter (Signed)
RF sent and pt aware.

## 2022-09-19 ENCOUNTER — Ambulatory Visit (INDEPENDENT_AMBULATORY_CARE_PROVIDER_SITE_OTHER): Payer: BC Managed Care – PPO | Admitting: Obstetrics and Gynecology

## 2022-09-19 ENCOUNTER — Other Ambulatory Visit (HOSPITAL_COMMUNITY)
Admission: RE | Admit: 2022-09-19 | Discharge: 2022-09-19 | Disposition: A | Discharge status: Home / Self Care | Payer: BC Managed Care – PPO | Source: Ambulatory Visit | Attending: Obstetrics and Gynecology | Admitting: Obstetrics and Gynecology

## 2022-09-19 ENCOUNTER — Encounter: Payer: Self-pay | Admitting: Obstetrics and Gynecology

## 2022-09-19 VITALS — BP 127/81 | HR 72 | Ht 65.0 in | Wt 139.0 lb

## 2022-09-19 DIAGNOSIS — Z01419 Encounter for gynecological examination (general) (routine) without abnormal findings: Secondary | ICD-10-CM

## 2022-09-19 DIAGNOSIS — Z124 Encounter for screening for malignant neoplasm of cervix: Secondary | ICD-10-CM

## 2022-09-19 DIAGNOSIS — N951 Menopausal and female climacteric states: Secondary | ICD-10-CM

## 2022-09-19 DIAGNOSIS — Z79890 Hormone replacement therapy (postmenopausal): Secondary | ICD-10-CM

## 2022-09-19 DIAGNOSIS — Z1231 Encounter for screening mammogram for malignant neoplasm of breast: Secondary | ICD-10-CM

## 2022-09-19 MED ORDER — ESTRADIOL-NORETHINDRONE ACET 1-0.5 MG PO TABS: 1.0000 | ORAL_TABLET | Freq: Every day | ORAL | 3 refills | Status: DC

## 2022-09-19 NOTE — Progress Notes (Signed)
Patients presents for annual exam today. She states doing well with current HRT, would like to continue. Due for pap smear, ordered. Patient is due for mammogram in October, ordered. Annual labs are ordered, will return when fasting. She states no other questions or concerns at this time.

## 2022-09-19 NOTE — Progress Notes (Signed)
HPI:      Kristin Little is a 62 y.o. G1P1001 who LMP was No LMP recorded. Patient is postmenopausal.  Subjective:   She presents today for her annual examination.  She has no complaints today.  She is taking Activella.  She reports no spotting or issues.  She would like to continue taking it.    Hx: The following portions of the patient's history were reviewed and updated as appropriate:             She  has a past medical history of Abnormal uterine bleeding (AUB), Anxiety, Arthritis, Chronic fatigue, Environmental allergies, Insomnia, Perimenopausal, Shoulder pain, and Squamous cell carcinoma. She does not have any pertinent problems on file. She  has a past surgical history that includes Tonsillectomy; Nasal sinus surgery; Dilation and curettage of uterus; Endometrial ablation; skin cancer removed; Colonoscopy with propofol (N/A, 07/04/2015); and Augmentation mammaplasty (2012). Her family history includes Breast cancer (age of onset: 55) in her mother; Endometriosis in her sister; Hypertension in her father; Hypothyroidism in her father; Seizures in her brother. She  reports that she has never smoked. She has never used smokeless tobacco. She reports current alcohol use. She reports that she does not use drugs. She has a current medication list which includes the following prescription(s): calcium-magnesium-vitamin d, cetirizine, and estradiol-norethindrone. She has No Known Allergies.       Review of Systems:  Review of Systems  Constitutional: Denied constitutional symptoms, night sweats, recent illness, fatigue, fever, insomnia and weight loss.  Eyes: Denied eye symptoms, eye pain, photophobia, vision change and visual disturbance.  Ears/Nose/Throat/Neck: Denied ear, nose, throat or neck symptoms, hearing loss, nasal discharge, sinus congestion and sore throat.  Cardiovascular: Denied cardiovascular symptoms, arrhythmia, chest pain/pressure, edema, exercise intolerance, orthopnea  and palpitations.  Respiratory: Denied pulmonary symptoms, asthma, pleuritic pain, productive sputum, cough, dyspnea and wheezing.  Gastrointestinal: Denied, gastro-esophageal reflux, melena, nausea and vomiting.  Genitourinary: Denied genitourinary symptoms including symptomatic vaginal discharge, pelvic relaxation issues, and urinary complaints.  Musculoskeletal: Denied musculoskeletal symptoms, stiffness, swelling, muscle weakness and myalgia.  Dermatologic: Denied dermatology symptoms, rash and scar.  Neurologic: Denied neurology symptoms, dizziness, headache, neck pain and syncope.  Psychiatric: Denied psychiatric symptoms, anxiety and depression.  Endocrine: Denied endocrine symptoms including hot flashes and night sweats.   Meds:   Current Outpatient Medications on File Prior to Visit  Medication Sig Dispense Refill   Calcium-Magnesium-Vitamin D (CALCIUM 1200+D3 PO)      cetirizine (ZYRTEC) 10 MG tablet Take 10 mg by mouth daily. (Patient not taking: Reported on 09/19/2022)     No current facility-administered medications on file prior to visit.     Objective:     Vitals:   09/19/22 1022 09/19/22 1029  BP: (!) 147/83 127/81  Pulse: 72     Filed Weights   09/19/22 1022  Weight: 139 lb (63 kg)              Physical examination General NAD, Conversant  HEENT Atraumatic; Op clear with mmm.  Normo-cephalic. Pupils reactive. Anicteric sclerae  Thyroid/Neck Smooth without nodularity or enlargement. Normal ROM.  Neck Supple.  Skin No rashes, lesions or ulceration. Normal palpated skin turgor. No nodularity.  Breasts: No masses or discharge.  Symmetric.  No axillary adenopathy.  Lungs: Clear to auscultation.No rales or wheezes. Normal Respiratory effort, no retractions.  Heart: NSR.  No murmurs or rubs appreciated. No peripheral edema  Abdomen: Soft.  Non-tender.  No masses.  No HSM. No  hernia  Extremities: Moves all appropriately.  Normal ROM for age. No lymphadenopathy.   Neuro: Oriented to PPT.  Normal mood. Normal affect.     Pelvic:   Vulva: Normal appearance.  No lesions.  Vagina: No lesions or abnormalities noted.  Support: Normal pelvic support.  Urethra No masses tenderness or scarring.  Meatus Normal size without lesions or prolapse.  Cervix: Normal appearance.  No lesions.  Anus: Normal exam.  No lesions.  Perineum: Normal exam.  No lesions.        Bimanual   Uterus: Normal size.  Non-tender.  Mobile.  AV.  Adnexae: No masses.  Non-tender to palpation.  Cul-de-sac: Negative for abnormality.     Assessment:    G1P1001 Patient Active Problem List   Diagnosis Date Noted   Dyspareunia, female 05/29/2017   Vaginal atrophy 05/29/2017   Insomnia 05/15/2016   Special screening for malignant neoplasms, colon    Status post endometrial ablation 05/04/2015   External hemorrhoid 05/04/2015   Hemorrhagic cystitis 02/28/2015   Menopause 10/21/2014     1. Well woman exam with routine gynecological exam   2. Screening mammogram for breast cancer   3. Cervical cancer screening   4. Symptomatic menopausal or female climacteric states   5. Postmenopausal hormone therapy     Normal exam  HRT without problem   Plan:            1.  Basic Screening Recommendations The basic screening recommendations for asymptomatic women were discussed with the patient during her visit.  The age-appropriate recommendations were discussed with her and the rational for the tests reviewed.  When I am informed by the patient that another primary care physician has previously obtained the age-appropriate tests and they are up-to-date, only outstanding tests are ordered and referrals given as necessary.  Abnormal results of tests will be discussed with her when all of her results are completed.  Routine preventative health maintenance measures emphasized: Exercise/Diet/Weight control, Tobacco Warnings, Alcohol/Substance use risks and Stress Management Pap performed  -mammogram ordered -patient to return for lab work 2.  Continue Activella  Orders Orders Placed This Encounter  Procedures   MM DIGITAL SCREENING BILATERAL   CBC   Basic metabolic panel   TSH   Lipid panel   Hemoglobin A1c     Meds ordered this encounter  Medications   estradiol-norethindrone (ACTIVELLA) 1-0.5 MG tablet    Sig: Take 1 tablet by mouth daily.    Dispense:  84 tablet    Refill:  3          F/U  No follow-ups on file.  Elonda Husky, M.D. 09/19/2022 10:42 AM

## 2022-09-20 LAB — CYTOLOGY - PAP
Comment: NEGATIVE
Diagnosis: NEGATIVE
High risk HPV: NEGATIVE

## 2022-09-26 ENCOUNTER — Other Ambulatory Visit: Payer: BC Managed Care – PPO

## 2022-09-26 DIAGNOSIS — Z01419 Encounter for gynecological examination (general) (routine) without abnormal findings: Secondary | ICD-10-CM | POA: Diagnosis not present

## 2022-09-27 LAB — LIPID PANEL
Chol/HDL Ratio: 3.3 ratio (ref 0.0–4.4)
Cholesterol, Total: 209 mg/dL — ABNORMAL HIGH (ref 100–199)
HDL: 63 mg/dL (ref >39)
LDL Chol Calc (NIH): 138 mg/dL — ABNORMAL HIGH (ref 0–99)
Triglycerides: 46 mg/dL (ref 0–149)
VLDL Cholesterol Cal: 8 mg/dL (ref 5–40)

## 2022-09-27 LAB — CBC
Hematocrit: 43.8 % (ref 34.0–46.6)
Hemoglobin: 14.7 g/dL (ref 11.1–15.9)
MCH: 33.4 pg — ABNORMAL HIGH (ref 26.6–33.0)
MCHC: 33.6 g/dL (ref 31.5–35.7)
MCV: 100 fL — ABNORMAL HIGH (ref 79–97)
Platelets: 165 10*3/uL (ref 150–450)
RBC: 4.4 x10E6/uL (ref 3.77–5.28)
RDW: 10.9 % — ABNORMAL LOW (ref 11.7–15.4)
WBC: 3.8 10*3/uL (ref 3.4–10.8)

## 2022-09-27 LAB — HEMOGLOBIN A1C
Est. average glucose Bld gHb Est-mCnc: 105 mg/dL
Hgb A1c MFr Bld: 5.3 % (ref 4.8–5.6)

## 2022-09-27 LAB — BASIC METABOLIC PANEL
BUN/Creatinine Ratio: 15 (ref 12–28)
BUN: 15 mg/dL (ref 8–27)
CO2: 25 mmol/L (ref 20–29)
Calcium: 9.1 mg/dL (ref 8.7–10.3)
Chloride: 103 mmol/L (ref 96–106)
Creatinine, Ser: 0.98 mg/dL (ref 0.57–1.00)
Glucose: 86 mg/dL (ref 70–99)
Potassium: 4.6 mmol/L (ref 3.5–5.2)
Sodium: 139 mmol/L (ref 134–144)
eGFR: 66 mL/min/{1.73_m2} (ref >59)

## 2022-09-27 LAB — TSH: TSH: 0.331 u[IU]/mL — ABNORMAL LOW (ref 0.450–4.500)

## 2022-10-03 ENCOUNTER — Encounter: Payer: Self-pay | Admitting: Obstetrics and Gynecology

## 2022-10-03 ENCOUNTER — Encounter: Payer: Self-pay | Admitting: Physician Assistant

## 2022-10-03 NOTE — Telephone Encounter (Signed)
Spoke with patient about lab work, she plans to follow-up with PCP due to cholesterol at this time.

## 2022-10-31 ENCOUNTER — Ambulatory Visit
Admission: RE | Admit: 2022-10-31 | Discharge: 2022-10-31 | Disposition: A | Discharge status: Home / Self Care | Payer: BC Managed Care – PPO | Source: Ambulatory Visit | Attending: Obstetrics and Gynecology | Admitting: Obstetrics and Gynecology

## 2022-10-31 DIAGNOSIS — Z1231 Encounter for screening mammogram for malignant neoplasm of breast: Secondary | ICD-10-CM | POA: Diagnosis not present

## 2022-10-31 DIAGNOSIS — Z01419 Encounter for gynecological examination (general) (routine) without abnormal findings: Secondary | ICD-10-CM | POA: Insufficient documentation

## 2022-11-02 ENCOUNTER — Other Ambulatory Visit: Payer: Self-pay | Admitting: Obstetrics and Gynecology

## 2022-11-02 DIAGNOSIS — R928 Other abnormal and inconclusive findings on diagnostic imaging of breast: Secondary | ICD-10-CM

## 2022-11-02 DIAGNOSIS — N63 Unspecified lump in unspecified breast: Secondary | ICD-10-CM

## 2022-11-06 ENCOUNTER — Other Ambulatory Visit: Payer: Self-pay | Admitting: Obstetrics and Gynecology

## 2022-11-06 DIAGNOSIS — R928 Other abnormal and inconclusive findings on diagnostic imaging of breast: Secondary | ICD-10-CM

## 2022-11-11 ENCOUNTER — Telehealth: Payer: BC Managed Care – PPO | Admitting: Family

## 2022-11-11 ENCOUNTER — Other Ambulatory Visit: Payer: Self-pay | Admitting: Obstetrics and Gynecology

## 2022-11-11 DIAGNOSIS — T63421A Toxic effect of venom of ants, accidental (unintentional), initial encounter: Secondary | ICD-10-CM | POA: Diagnosis not present

## 2022-11-11 DIAGNOSIS — Z79890 Hormone replacement therapy (postmenopausal): Secondary | ICD-10-CM

## 2022-11-11 DIAGNOSIS — N951 Menopausal and female climacteric states: Secondary | ICD-10-CM

## 2022-11-11 MED ORDER — TRIAMCINOLONE ACETONIDE 0.5 % EX OINT: 1.0000 | TOPICAL_OINTMENT | Freq: Two times a day (BID) | CUTANEOUS | 0 refills | Status: AC

## 2022-11-11 MED ORDER — PREDNISONE 20 MG PO TABS: 40.0000 mg | ORAL_TABLET | Freq: Every day | ORAL | 0 refills | Status: AC

## 2022-11-11 NOTE — Progress Notes (Signed)
E-Visit for Insect Sting  Thank you for describing the insect sting for [place].  Here is how we plan to help!  A sting that we will treat with a short course of prednisone.  The 2 greatest risks from insect stings are allergic reaction, which can be fatal in some people and infection, which is more common and less serious.  Bees, wasps, yellow jackets, and hornets belong to a class of insects called Hymenoptera.  Most insect stings cause only minor discomfort.  Stings can happen anywhere on the body and can be painful.  Most stings are from honey bees or yellow jackets.  Fire ants can sting multiple times.  The sites of the stings are more likely to become infected.    Based on your information I have:, Provided a home care guide for insect stings and instructions on when to call for help., and I have sent in prednisone 40 mg by mouth daily for 5 days to the pharmacy you selected.  I have also sent in kenalog prescription that is a steroid cream you placed on the area. Please make sure that you selected a pharmacy that is open now.  What can be used to prevent Insect Stings?  Insect repellant with at least 20% DEET.  Wearing long pants and shirts with socks and shoes.  Wear dark or drab-colored clothes rather than bright colors.  Avoid using perfumes and hair sprays; these attract insects.  HOME CARE ADVICE:  1. Stinger removal: The stinger looks like a tiny black dot in the sting. Use a fingernail, credit card edge, or knife-edge to scrape it off.  Don't pull it out because it squeezes out more venom. If the stinger is below the skin surface, leave it alone.  It will be shed with normal skin healing. 2. Use cold compresses to the area of the sting for 10-20 minutes.  You may repeat this as needed to relieve symptoms of pain and swelling. 3.  For pain relief, take acetominophen 650 mg 4-6 hours as needed or ibuprofen 400 mg every 6-8 hours as needed or naproxen 250-500 mg every 12 hours as  needed. 4.  You can also use hydrocortisone cream 0.5% or 1% up to 4 times daily as needed for itching. 5.  If the sting becomes very itchy, take Benadryl 25-50 mg, follow directions on box. 6.  Wash the area 2-3 times daily with antibacterial soap and warm water. 7. Call your Doctor if: Fever, a severe headache, or rash occur in the next 2 weeks. Sting area begins to look infected. Redness and swelling worsens after home treatment. Your current symptoms become worse.    MAKE SURE YOU:  Understand these instructions. Will watch your condition. Will get help right away if you are not doing well or get worse.  Thank you for choosing an e-visit.  Your e-visit answers were reviewed by a board certified advanced clinical practitioner to complete your personal care plan. Depending upon the condition, your plan could have included both over the counter or prescription medications.  Please review your pharmacy choice. Make sure the pharmacy is open so you can pick up prescription now. If there is a problem, you may contact your provider through [employer] and have the prescription routed to another pharmacy.  Your safety is important to [place]. If you have drug allergies check your prescription carefully.   For the next 24 hours you can use MyChart to ask questions about today's visit, request a non-urgent call  back, or ask for a work or school excuse. You will get an email in the next two days asking about your experience. I hope that your e-visit has been valuable and will speed your recovery.  Approximately 5 minutes was spent documenting and reviewing patient's chart.

## 2022-11-12 ENCOUNTER — Encounter: Payer: Self-pay | Admitting: Obstetrics and Gynecology

## 2022-11-12 ENCOUNTER — Telehealth: Payer: Self-pay

## 2022-11-12 NOTE — Telephone Encounter (Signed)
Patient called requesting refill on Activella. She said it was refused by PCP. She had annual in September. Please advise.

## 2022-11-13 ENCOUNTER — Other Ambulatory Visit: Payer: Self-pay

## 2022-11-13 DIAGNOSIS — N951 Menopausal and female climacteric states: Secondary | ICD-10-CM

## 2022-11-13 DIAGNOSIS — Z79890 Hormone replacement therapy (postmenopausal): Secondary | ICD-10-CM

## 2022-11-13 MED ORDER — ESTRADIOL-NORETHINDRONE ACET 1-0.5 MG PO TABS: 1.0000 | ORAL_TABLET | Freq: Every day | ORAL | 3 refills | Status: AC

## 2022-11-14 ENCOUNTER — Ambulatory Visit
Admission: RE | Admit: 2022-11-14 | Discharge: 2022-11-14 | Disposition: A | Discharge status: Home / Self Care | Payer: BC Managed Care – PPO | Source: Ambulatory Visit | Attending: Obstetrics and Gynecology | Admitting: Obstetrics and Gynecology

## 2022-11-14 DIAGNOSIS — N6321 Unspecified lump in the left breast, upper outer quadrant: Secondary | ICD-10-CM | POA: Diagnosis not present

## 2022-11-14 DIAGNOSIS — R928 Other abnormal and inconclusive findings on diagnostic imaging of breast: Secondary | ICD-10-CM

## 2022-11-14 DIAGNOSIS — R92333 Mammographic heterogeneous density, bilateral breasts: Secondary | ICD-10-CM | POA: Diagnosis not present

## 2022-11-15 ENCOUNTER — Other Ambulatory Visit: Payer: Self-pay | Admitting: Obstetrics and Gynecology

## 2022-11-15 DIAGNOSIS — R928 Other abnormal and inconclusive findings on diagnostic imaging of breast: Secondary | ICD-10-CM

## 2022-11-15 DIAGNOSIS — N63 Unspecified lump in unspecified breast: Secondary | ICD-10-CM

## 2022-11-15 NOTE — Telephone Encounter (Signed)
Refill has been sent in.  

## 2022-11-20 ENCOUNTER — Ambulatory Visit: Payer: BC Managed Care – PPO | Admitting: Podiatry

## 2022-11-21 ENCOUNTER — Ambulatory Visit
Admission: RE | Admit: 2022-11-21 | Discharge: 2022-11-21 | Disposition: A | Discharge status: Home / Self Care | Payer: BC Managed Care – PPO | Source: Ambulatory Visit | Attending: Obstetrics and Gynecology | Admitting: Obstetrics and Gynecology

## 2022-11-21 DIAGNOSIS — N63 Unspecified lump in unspecified breast: Secondary | ICD-10-CM | POA: Insufficient documentation

## 2022-11-21 DIAGNOSIS — D242 Benign neoplasm of left breast: Secondary | ICD-10-CM | POA: Diagnosis not present

## 2022-11-21 DIAGNOSIS — R928 Other abnormal and inconclusive findings on diagnostic imaging of breast: Secondary | ICD-10-CM | POA: Insufficient documentation

## 2022-11-21 DIAGNOSIS — N6321 Unspecified lump in the left breast, upper outer quadrant: Secondary | ICD-10-CM | POA: Diagnosis not present

## 2022-11-21 HISTORY — PX: BREAST BIOPSY: SHX20

## 2022-11-21 MED ORDER — LIDOCAINE 1 % OPTIME INJ - NO CHARGE
10.0000 mL | Freq: Once | INTRAMUSCULAR | Status: AC
  Administered 2022-11-21: 10 mL
  Filled 2022-11-21: qty 10

## 2022-11-22 LAB — SURGICAL PATHOLOGY

## 2022-11-29 ENCOUNTER — Encounter: Payer: Self-pay | Admitting: Obstetrics and Gynecology

## 2022-11-29 DIAGNOSIS — D485 Neoplasm of uncertain behavior of skin: Secondary | ICD-10-CM | POA: Diagnosis not present

## 2022-11-29 DIAGNOSIS — D225 Melanocytic nevi of trunk: Secondary | ICD-10-CM | POA: Diagnosis not present

## 2022-11-29 DIAGNOSIS — L570 Actinic keratosis: Secondary | ICD-10-CM | POA: Diagnosis not present

## 2022-11-29 DIAGNOSIS — C44519 Basal cell carcinoma of skin of other part of trunk: Secondary | ICD-10-CM | POA: Diagnosis not present

## 2022-11-29 DIAGNOSIS — D2261 Melanocytic nevi of right upper limb, including shoulder: Secondary | ICD-10-CM | POA: Diagnosis not present

## 2022-11-29 DIAGNOSIS — Z85828 Personal history of other malignant neoplasm of skin: Secondary | ICD-10-CM | POA: Diagnosis not present

## 2022-11-29 DIAGNOSIS — D2262 Melanocytic nevi of left upper limb, including shoulder: Secondary | ICD-10-CM | POA: Diagnosis not present

## 2022-12-28 ENCOUNTER — Telehealth: Payer: BC Managed Care – PPO | Admitting: Physician Assistant

## 2022-12-28 DIAGNOSIS — R051 Acute cough: Secondary | ICD-10-CM

## 2022-12-28 DIAGNOSIS — J019 Acute sinusitis, unspecified: Secondary | ICD-10-CM | POA: Diagnosis not present

## 2022-12-28 DIAGNOSIS — B9689 Other specified bacterial agents as the cause of diseases classified elsewhere: Secondary | ICD-10-CM

## 2022-12-28 MED ORDER — PREDNISONE 20 MG PO TABS
40.0000 mg | ORAL_TABLET | Freq: Every day | ORAL | 0 refills | Status: DC
Start: 2022-12-28 — End: 2023-05-17

## 2022-12-28 MED ORDER — BENZONATATE 100 MG PO CAPS
100.0000 mg | ORAL_CAPSULE | Freq: Three times a day (TID) | ORAL | 0 refills | Status: DC | PRN
Start: 2022-12-28 — End: 2023-10-14

## 2022-12-28 MED ORDER — AMOXICILLIN-POT CLAVULANATE 875-125 MG PO TABS
1.0000 | ORAL_TABLET | Freq: Two times a day (BID) | ORAL | 0 refills | Status: DC
Start: 2022-12-28 — End: 2023-10-14

## 2022-12-28 NOTE — Progress Notes (Signed)
E-Visit for Sinus Problems  We are sorry that you are not feeling well.  Here is how we plan to help!  Based on what you have shared with me it looks like you have sinusitis.  Sinusitis is inflammation and infection in the sinus cavities of the head.  Based on your presentation I believe you most likely have Acute Bacterial Sinusitis.  This is an infection caused by bacteria and is treated with antibiotics. I have prescribed Augmentin 875mg /125mg  one tablet twice daily with food, for 10 days. I have also prescribed Prednisone 20mg  Take 2 tablets (40mg ) daily with food for 5 days and Tessalon perles Take 1-2 capsules every 8 hours as needed for cough.  You may use an oral decongestant such as Mucinex D or if you have glaucoma or high blood pressure use plain Mucinex. Saline nasal spray help and can safely be used as often as needed for congestion.  If you develop worsening sinus pain, fever or notice severe headache and vision changes, or if symptoms are not better after completion of antibiotic, please schedule an appointment with a health care provider.    Sinus infections are not as easily transmitted as other respiratory infection, however we still recommend that you avoid close contact with loved ones, especially the very young and elderly.  Remember to wash your hands thoroughly throughout the day as this is the number one way to prevent the spread of infection!  Home Care: Only take medications as instructed by your medical team. Complete the entire course of an antibiotic. Do not take these medications with alcohol. A steam or ultrasonic humidifier can help congestion.  You can place a towel over your head and breathe in the steam from hot water coming from a faucet. Avoid close contacts especially the very young and the elderly. Cover your mouth when you cough or sneeze. Always remember to wash your hands.  Get Help Right Away If: You develop worsening fever or sinus pain. You develop a  severe head ache or visual changes. Your symptoms persist after you have completed your treatment plan.  Make sure you Understand these instructions. Will watch your condition. Will get help right away if you are not doing well or get worse.  Thank you for choosing an e-visit.  Your e-visit answers were reviewed by a board certified advanced clinical practitioner to complete your personal care plan. Depending upon the condition, your plan could have included both over the counter or prescription medications.  Please review your pharmacy choice. Make sure the pharmacy is open so you can pick up prescription now. If there is a problem, you may contact your provider through [employer] and have the prescription routed to another pharmacy.  Your safety is important to [place]. If you have drug allergies check your prescription carefully.   For the next 24 hours you can use MyChart to ask questions about today's visit, request a non-urgent call back, or ask for a work or school excuse. You will get an email in the next two days asking about your experience. I hope that your e-visit has been valuable and will speed your recovery.   I have spent 5 minutes in review of e-visit questionnaire, review and updating patient chart, medical decision making and response to patient.   Margaretann Loveless, PA-C

## 2022-12-31 DIAGNOSIS — C44519 Basal cell carcinoma of skin of other part of trunk: Secondary | ICD-10-CM | POA: Diagnosis not present

## 2022-12-31 DIAGNOSIS — H52223 Regular astigmatism, bilateral: Secondary | ICD-10-CM | POA: Diagnosis not present

## 2022-12-31 DIAGNOSIS — H5203 Hypermetropia, bilateral: Secondary | ICD-10-CM | POA: Diagnosis not present

## 2022-12-31 DIAGNOSIS — H524 Presbyopia: Secondary | ICD-10-CM | POA: Diagnosis not present

## 2023-03-01 ENCOUNTER — Ambulatory Visit (INDEPENDENT_AMBULATORY_CARE_PROVIDER_SITE_OTHER): Payer: BC Managed Care – PPO

## 2023-03-01 ENCOUNTER — Ambulatory Visit: Payer: BC Managed Care – PPO | Admitting: Podiatry

## 2023-03-01 ENCOUNTER — Encounter: Payer: Self-pay | Admitting: Podiatry

## 2023-03-01 DIAGNOSIS — M2042 Other hammer toe(s) (acquired), left foot: Secondary | ICD-10-CM

## 2023-03-01 DIAGNOSIS — M7752 Other enthesopathy of left foot: Secondary | ICD-10-CM

## 2023-03-01 DIAGNOSIS — M2041 Other hammer toe(s) (acquired), right foot: Secondary | ICD-10-CM | POA: Diagnosis not present

## 2023-03-01 DIAGNOSIS — G5790 Unspecified mononeuropathy of unspecified lower limb: Secondary | ICD-10-CM

## 2023-03-01 DIAGNOSIS — M21622 Bunionette of left foot: Secondary | ICD-10-CM

## 2023-03-01 MED ORDER — GABAPENTIN 100 MG PO CAPS: 100.0000 mg | ORAL_CAPSULE | Freq: Every day | ORAL | 1 refills | Status: AC

## 2023-03-01 NOTE — Progress Notes (Signed)
Chief Complaint  Patient presents with   Foot Pain    "Dr. Charlsie Merles did a Neurectomy on both feet years ago.  I am having trouble with my toes next to my pinkie toes.  They get red and swollen and keep me up at night.  I had pain on my right foot around the middle of my foot all the way around it.  It's not there now. This place on my left foot is so painful, it stays sore." N - painful toes L - 4th digits bilateral, rt > lt D - six to eight mos. O - suddenly, about the same C - stay numb, burning, red, swelling A - none T - none   Tinea Pedis    "The left foot only on the side itches all the time and that keeps me up sometime." N - foot itches L - lateral left D - 2 months O - suddenly, about the same C - itches, keeps me up at night A - none T - lotion    HPI: 63 y.o. female presenting today for evaluation of pain and tenderness associated to the fourth digits bilateral.  History of neurectomy to the fourth interspace bilateral with Dr. Charlsie Merles.  Patient has persistent numbness to the fourth digits bilateral but over the last 6 months she has developed severe neuritis type symptoms isolated to the fourth toes bilateral.  Worse at night.  She presents for further treatment evaluation  Past Medical History:  Diagnosis Date   Abnormal uterine bleeding (AUB)    Anxiety    Arthritis    shoulder   Chronic fatigue    Environmental allergies    Insomnia    Perimenopausal    Shoulder pain    inj at [facility]   Squamous cell carcinoma     Past Surgical History:  Procedure Laterality Date   AUGMENTATION MAMMAPLASTY  2012   BREAST BIOPSY Left 11/21/2022   coil clip/ path pending   BREAST BIOPSY Left 11/21/2022   [place] LT BREAST BX W LOC DEV 1ST LESION IMG BX SPEC US GUIDE 11/21/2022 [facility]   COLONOSCOPY WITH PROPOFOL N/A 07/04/2015   Procedure: COLONOSCOPY WITH PROPOFOL;  Surgeon: Midge Minium, MD;  Location: [facility];  Service: Endoscopy;  Laterality: N/A;   DILATION AND  CURETTAGE OF UTERUS     ENDOMETRIAL ABLATION     NASAL SINUS SURGERY     skin cancer removed     TONSILLECTOMY      No Known Allergies   Physical Exam: General: The patient is alert and oriented x3 in no acute distress.  Dermatology: Skin is warm, dry and supple bilateral lower extremities.   Vascular: Palpable pedal pulses bilaterally. Capillary refill within normal limits.  No appreciable edema.  No erythema.  Neurological: Grossly intact via light touch  Musculoskeletal Exam: No pedal deformities noted.  Associated paresthesia with palpation to the fourth digits bilateral  Radiographic Exam B/L feet 03/01/2023:  Normal osseous mineralization. Joint spaces preserved.  No fractures or osseous irregularities noted.  Orthopedic screw noted to the distal head of the fifth metatarsal of the right foot from prior tailor's bunion surgery  Assessment/Plan of Care: 1.  Neuritis type symptoms with paresthesia fourth digits bilateral; most severe at night 2.  Pruritus lateral aspect of the left foot  -Patient evaluated.  X-rays reviewed -Prescription for gabapentin 100 mg nightly to see if this helps alleviate some of her neuritis type symptoms to the fourth toes bilateral -Prescription  order form was filled out today to [facility] in [place], [place] for neuropathic cream to apply daily -Recommend wide fitting shoes that do not irritate or constrict the toebox area -Patient states that she has a history of eczema and has an active prescription for triamcinolone cream.  Recommend applying to the lateral aspect of the left foot as needed -Return to clinic as needed       Felecia Shelling, DPM [facility]  Dr. Felecia Shelling, DPM    [address] [place], [place] [address]                Office [phone]  Fax [phone]

## 2023-05-17 ENCOUNTER — Telehealth: Admitting: Family Medicine

## 2023-05-17 DIAGNOSIS — R21 Rash and other nonspecific skin eruption: Secondary | ICD-10-CM

## 2023-05-17 DIAGNOSIS — L237 Allergic contact dermatitis due to plants, except food: Secondary | ICD-10-CM | POA: Diagnosis not present

## 2023-05-17 MED ORDER — TRIAMCINOLONE ACETONIDE 0.1 % EX CREA: 1.0000 | TOPICAL_CREAM | Freq: Two times a day (BID) | CUTANEOUS | 0 refills | Status: AC

## 2023-05-17 MED ORDER — PREDNISONE 20 MG PO TABS
40.0000 mg | ORAL_TABLET | Freq: Every day | ORAL | 0 refills | Status: DC
Start: 2023-05-17 — End: 2023-10-14

## 2023-05-17 NOTE — Progress Notes (Signed)
 E Visit for Rash  We are sorry that you are not feeling well. Here is how we plan to help!  Based on what you shared with me it looks like you have contact dermatitis.  Contact dermatitis is a skin rash caused by something that touches the skin and causes irritation or inflammation.  Your skin may be red, swollen, dry, cracked, and itch.  The rash should go away in a few days but can last a few weeks.  If you get a rash, it's important to figure out what caused it so the irritant can be avoided in the future.  I will send prednisone  and triamcinolone  to treat.    HOME CARE:  Take cool showers and avoid direct sunlight. Apply cool compress or wet dressings. Take a bath in an oatmeal bath.  Sprinkle content of one Aveeno packet under running faucet with comfortably warm water .  Bathe for 15-20 minutes, 1-2 times daily.  Pat dry with a towel. Do not rub the rash. Use hydrocortisone cream. Take an antihistamine like Benadryl for widespread rashes that itch.  The adult dose of Benadryl is 25-50 mg by mouth 4 times daily. Caution:  This type of medication may cause sleepiness.  Do not drink alcohol, drive, or operate dangerous machinery while taking antihistamines.  Do not take these medications if you have prostate enlargement.  Read package instructions thoroughly on all medications that you take.  GET HELP RIGHT AWAY IF:  Symptoms don't go away after treatment. Severe itching that persists. If you rash spreads or swells. If you rash begins to smell. If it blisters and opens or develops a yellow-brown crust. You develop a fever. You have a sore throat. You become short of breath.  MAKE SURE YOU:  Understand these instructions. Will watch your condition. Will get help right away if you are not doing well or get worse.  Thank you for choosing an e-visit.  Your e-visit answers were reviewed by a board certified advanced clinical practitioner to complete your personal care plan.  Depending upon the condition, your plan could have included both over the counter or prescription medications.  Please review your pharmacy choice. Make sure the pharmacy is open so you can pick up prescription now. If there is a problem, you may contact your provider through [employer] and have the prescription routed to another pharmacy.  Your safety is important to us . If you have drug allergies check your prescription carefully.   For the next 24 hours you can use MyChart to ask questions about today's visit, request a non-urgent call back, or ask for a work or school excuse. You will get an email in the next two days asking about your experience. I hope that your e-visit has been valuable and will speed your recovery.    have provided 5 minutes of non face to face time during this encounter for chart review and documentation.

## 2023-10-06 ENCOUNTER — Other Ambulatory Visit: Payer: Self-pay | Admitting: Obstetrics and Gynecology

## 2023-10-06 DIAGNOSIS — N951 Menopausal and female climacteric states: Secondary | ICD-10-CM

## 2023-10-06 DIAGNOSIS — Z79890 Hormone replacement therapy (postmenopausal): Secondary | ICD-10-CM

## 2023-10-07 NOTE — Telephone Encounter (Signed)
 Patient reports she will be seeing her PCP from now on.

## 2023-10-14 ENCOUNTER — Encounter: Payer: Self-pay | Admitting: Family Medicine

## 2023-10-14 ENCOUNTER — Ambulatory Visit: Admitting: Family Medicine

## 2023-10-14 VITALS — BP 114/74 | HR 66 | Temp 98.2°F | Ht 65.0 in | Wt 132.5 lb

## 2023-10-14 DIAGNOSIS — N952 Postmenopausal atrophic vaginitis: Secondary | ICD-10-CM | POA: Diagnosis not present

## 2023-10-14 DIAGNOSIS — Z9189 Other specified personal risk factors, not elsewhere classified: Secondary | ICD-10-CM | POA: Diagnosis not present

## 2023-10-14 DIAGNOSIS — Z23 Encounter for immunization: Secondary | ICD-10-CM

## 2023-10-14 DIAGNOSIS — Z1159 Encounter for screening for other viral diseases: Secondary | ICD-10-CM

## 2023-10-14 DIAGNOSIS — N182 Chronic kidney disease, stage 2 (mild): Secondary | ICD-10-CM | POA: Diagnosis not present

## 2023-10-14 DIAGNOSIS — G479 Sleep disorder, unspecified: Secondary | ICD-10-CM

## 2023-10-14 DIAGNOSIS — Z114 Encounter for screening for human immunodeficiency virus [HIV]: Secondary | ICD-10-CM

## 2023-10-14 DIAGNOSIS — Z0001 Encounter for general adult medical examination with abnormal findings: Secondary | ICD-10-CM

## 2023-10-14 DIAGNOSIS — Z130 Encounter for screening for diseases of the blood and blood-forming organs and certain disorders involving the immune mechanism: Secondary | ICD-10-CM | POA: Diagnosis not present

## 2023-10-14 DIAGNOSIS — Z1231 Encounter for screening mammogram for malignant neoplasm of breast: Secondary | ICD-10-CM

## 2023-10-14 DIAGNOSIS — E041 Nontoxic single thyroid nodule: Secondary | ICD-10-CM | POA: Diagnosis not present

## 2023-10-14 DIAGNOSIS — Z1329 Encounter for screening for other suspected endocrine disorder: Secondary | ICD-10-CM | POA: Diagnosis not present

## 2023-10-14 DIAGNOSIS — Z136 Encounter for screening for cardiovascular disorders: Secondary | ICD-10-CM

## 2023-10-14 DIAGNOSIS — Z13228 Encounter for screening for other metabolic disorders: Secondary | ICD-10-CM | POA: Diagnosis not present

## 2023-10-14 DIAGNOSIS — Z0000 Encounter for general adult medical examination without abnormal findings: Secondary | ICD-10-CM

## 2023-10-14 MED ORDER — TRAZODONE HCL 50 MG PO TABS
25.0000 mg | ORAL_TABLET | Freq: Every evening | ORAL | 3 refills | Status: AC | PRN
Start: 2023-10-14 — End: ?

## 2023-10-14 NOTE — Progress Notes (Signed)
 New patient visit   Patient: Kristin Little   DOB: 1960-06-01   63 y.o. Female  MRN: [id number] Visit Date: 10/14/2023  Today's healthcare provider: LAURAINE LOISE BUOY, DO   Chief Complaint  Patient presents with   Establish Care   Medication Management    Wants to know if she still need estradiol  and if so needs refills   Headache   Insomnia   Subjective    Kristin Little is a 63 y.o. female who presents today as a new patient to establish care.  Headache  Associated symptoms include insomnia. Pertinent negatives include no abdominal pain, back pain, coughing, dizziness, ear pain, eye pain, eye redness, fever, nausea, numbness, rhinorrhea, seizures, sore throat or weakness.  Insomnia Primary symptoms: sleep disturbance.     HPI     Medication Management    Additional comments: Wants to know if she still need estradiol  and if so needs refills      Last edited by Yvone Pierce, CMA on 10/14/2023 10:31 AM.      [name] is a 63 year old female who presents to establish care and discuss postmenopausal symptoms and headaches.  She is considering whether to continue estradiol -norethindrone  for postmenopausal symptoms, including hot flashes. She experiences occasional night-time hot flashes and is concerned about the medication's impact on her history of headaches, which were previously exacerbated by birth control pills. She has one pack of the medication left and is uncertain about continuing it.  Her headaches last three days and are unresponsive to over-the-counter medications like Tylenol, Advil, ibuprofen, and acetaminophen. During headaches, she uses an ice pack to help her sleep. She has not been diagnosed with migraines but notes that her headaches persist through sleep.   She experiences sleep disturbances, often waking up and staying awake for hours during the night. She sometimes falls asleep easily but frequently wakes up around 3 AM and struggles to return to  sleep. She has tried various methods to improve sleep, including avoiding caffeine in the afternoon and maintaining a calm evening routine. She previously used trazodone  and another unspecified medication to help establish a sleep routine, which was effective at the time.  She experiences pain during sexual activity and has tried Premarin  cream, which she found messy and unpleasant. She has not tried other vaginal moisturizers or lubricants extensively.  Her current medications include calcium and generic Flonase, used consistently, while gabapentin  and triamcinolone  creams are used as needed. She has a history of a thyroid  scan with low TSH levels but has not been on thyroid  medication. She also mentions a bone spur in her hand causing tingling, which she manages conservatively and for which she prefers to avoid surgery.  She is due for a mammogram and is vigilant about breast health due to her mother's history of breast cancer. She had a breast biopsy last year and is following up with recommended imaging. She is considering vaccines for pneumonia, shingles, and tetanus, and has not had the flu shot in recent years.    Past Medical History:  Diagnosis Date   Abnormal uterine bleeding (AUB)    Allergy    Seasonal allergies most of my life   Anxiety    Arthritis    shoulder   Chronic fatigue    Environmental allergies    Insomnia    Perimenopausal    Shoulder pain    inj at [facility]   Squamous cell carcinoma    Past Surgical History:  Procedure Laterality Date   AUGMENTATION MAMMAPLASTY  2012   BREAST BIOPSY Left 11/21/2022   coil clip/ path pending   BREAST BIOPSY Left 11/21/2022   US  LT BREAST BX W LOC DEV 1ST LESION IMG BX SPEC US  GUIDE 11/21/2022 [facility]   COLONOSCOPY WITH PROPOFOL  N/A 07/04/2015   Procedure: COLONOSCOPY WITH PROPOFOL ;  Surgeon: Rogelia Copping, MD;  Location: [facility];  Service: Endoscopy;  Laterality: N/A;   DILATION AND CURETTAGE OF UTERUS      ENDOMETRIAL ABLATION     NASAL SINUS SURGERY     skin cancer removed     TONSILLECTOMY     Family Status  Relation Name Status   Mother Avel Saucer Alive   Father Horticulturist, commercial Alive   Sister  Alive   Brother  Alive   MGF Willie Wheeley Alive   MGM Nannie Wheeley Alive   Neg Hx  (Not Specified)  No partnership data on file   Family History  Problem Relation Age of Onset   Breast cancer Mother 63   Cancer Mother    Hearing loss Mother    Hypertension Father    Hypothyroidism Father    Endometriosis Sister    Seizures Brother    Cancer Maternal Grandfather    Cancer Maternal Grandmother    Diabetes Neg Hx    Ovarian cancer Neg Hx    Social History   Socioeconomic History   Marital status: Married    Spouse name: Not on file   Number of children: Not on file   Years of education: Not on file   Highest education level: Associate degree: occupational, Scientist, product/process development, or vocational program  Occupational History   Not on file  Tobacco Use   Smoking status: Never   Smokeless tobacco: Never  Vaping Use   Vaping status: Never Used  Substance and Sexual Activity   Alcohol use: Yes    Alcohol/week: 3.0 - 4.0 standard drinks of alcohol    Types: 3 - 4 Standard drinks or equivalent per week    Comment: social   Drug use: No   Sexual activity: Not Currently    Partners: Male    Birth control/protection: Post-menopausal  Other Topics Concern   Not on file  Social History Narrative   Not on file   Social Drivers of Health   Financial Resource Strain: Low Risk  (10/07/2023)   Overall Financial Resource Strain (CARDIA)    Difficulty of Paying Living Expenses: Not very hard  Food Insecurity: No Food Insecurity (10/07/2023)   Hunger Vital Sign    Worried About Running Out of Food in the Last Year: Never true    Ran Out of Food in the Last Year: Never true  Transportation Needs: No Transportation Needs (10/07/2023)   PRAPARE - Administrator, Civil Service  (Medical): No    Lack of Transportation (Non-Medical): No  Physical Activity: Insufficiently Active (10/07/2023)   Exercise Vital Sign    Days of Exercise per Week: 5 days    Minutes of Exercise per Session: 20 min  Stress: Stress Concern Present (10/07/2023)   [employer] of Occupational Health - Occupational Stress Questionnaire    Feeling of Stress: To some extent  Social Connections: Moderately Integrated (10/07/2023)   Social Connection and Isolation Panel    Frequency of Communication with Friends and Family: More than three times a week    Frequency of Social Gatherings with Friends and Family: Three times a week  Attends Religious Services: More than 4 times per year    Active Member of Clubs or Organizations: No    Attends Banker Meetings: Not on file    Marital Status: Married   Outpatient Medications Prior to Visit  Medication Sig   Calcium-Magnesium-Vitamin D  (CALCIUM 1200+D3 PO)    estradiol -norethindrone  (ACTIVELLA) 1-0.5 MG tablet Take 1 tablet by mouth daily.   fluticasone (FLONASE) 50 MCG/ACT nasal spray Place 1 spray into both nostrils daily.   gabapentin  (NEURONTIN ) 100 MG capsule Take 1 capsule (100 mg total) by mouth at bedtime.   triamcinolone  cream (KENALOG ) 0.1 % Apply 1 Application topically 2 (two) times daily.   triamcinolone  ointment (KENALOG ) 0.5 % Apply 1 Application topically 2 (two) times daily.   [DISCONTINUED] cetirizine (ZYRTEC) 10 MG tablet Take 10 mg by mouth daily.   [DISCONTINUED] predniSONE  (DELTASONE ) 20 MG tablet Take 2 tablets (40 mg total) by mouth daily with breakfast.   [DISCONTINUED] amoxicillin -clavulanate (AUGMENTIN ) 875-125 MG tablet Take 1 tablet by mouth 2 (two) times daily.   [DISCONTINUED] benzonatate  (TESSALON ) 100 MG capsule Take 1-2 capsules (100-200 mg total) by mouth 3 (three) times daily as needed.   No facility-administered medications prior to visit.   No Known Allergies  Immunization History   Administered Date(s) Administered   Influenza,inj,Quad PF,6+ Mos 10/27/2016, 10/04/2017, 12/15/2018, 12/01/2019   Influenza-Unspecified 10/14/2015   PFIZER(Purple Top)SARS-COV-2 Vaccination 04/09/2019, 05/06/2019, 05/27/2019   Tdap 10/14/2023   Zoster Recombinant(Shingrix) 10/14/2023    Health Maintenance  Topic Date Due   Pneumococcal Vaccine: 50+ Years (1 of 1 - PCV) 10/28/2023 (Originally 04/19/2012)   COVID-19 Vaccine (4 - 2025-26 season) 10/30/2023 (Originally 09/16/2023)   Influenza Vaccine  04/14/2024 (Originally 08/16/2023)   Zoster Vaccines- Shingrix (2 of 2) 12/09/2023   Mammogram  11/13/2024   Colonoscopy  07/03/2025   Cervical Cancer Screening (HPV/Pap Cotest)  09/19/2027   DTaP/Tdap/Td (2 - Td or Tdap) 10/13/2033   Hepatitis C Screening  Completed   HIV Screening  Completed   Hepatitis B Vaccines 19-59 Average Risk  Aged Out   HPV VACCINES  Aged Out   Meningococcal B Vaccine  Aged Out    Patient Care Team: Yazlin Ekblad, Lauraine SAILOR, DO as PCP - General (Family Medicine)  Review of Systems  Constitutional:  Negative for activity change, chills, fatigue, fever and unexpected weight change.  HENT:  Negative for congestion, ear pain, rhinorrhea, sneezing and sore throat.   Eyes: Negative.  Negative for pain and redness.  Respiratory:  Negative for cough, shortness of breath and wheezing.   Cardiovascular:  Negative for chest pain, palpitations and leg swelling.  Gastrointestinal:  Negative for abdominal pain, blood in stool, constipation, diarrhea and nausea.  Endocrine: Negative for polydipsia and polyphagia.  Genitourinary: Negative.  Negative for dysuria, flank pain, hematuria, pelvic pain, vaginal bleeding and vaginal discharge.  Musculoskeletal:  Negative for arthralgias, back pain, gait problem and joint swelling.  Skin:  Negative for rash.  Neurological:  Positive for headaches. Negative for dizziness, tremors, seizures, weakness, light-headedness and numbness.   Hematological:  Negative for adenopathy.  Psychiatric/Behavioral:  Positive for sleep disturbance. Negative for behavioral problems, confusion and dysphoric mood. The patient has insomnia. The patient is not nervous/anxious and is not hyperactive.         Objective    BP 114/74   Pulse 66   Temp 98.2 F (36.8 C)   Ht 5' 5 (1.651 m)   Wt 132 lb 8 oz (60.1 kg)  SpO2 100%   BMI 22.05 kg/m     Physical Exam Vitals and nursing note reviewed.  Constitutional:      General: She is awake.     Appearance: Normal appearance.  HENT:     Head: Normocephalic and atraumatic.     Right Ear: Tympanic membrane, ear canal and external ear normal.     Left Ear: Tympanic membrane, ear canal and external ear normal.     Nose: Nose normal.     Mouth/Throat:     Mouth: Mucous membranes are moist.     Pharynx: Oropharynx is clear. No oropharyngeal exudate or posterior oropharyngeal erythema.  Eyes:     General: No scleral icterus.    Extraocular Movements: Extraocular movements intact.     Conjunctiva/sclera: Conjunctivae normal.     Pupils: Pupils are equal, round, and reactive to light.  Neck:     Thyroid : No thyromegaly or thyroid  tenderness.  Cardiovascular:     Rate and Rhythm: Normal rate and regular rhythm.     Pulses: Normal pulses.     Heart sounds: Normal heart sounds.  Pulmonary:     Effort: Pulmonary effort is normal. No tachypnea, bradypnea or respiratory distress.     Breath sounds: Normal breath sounds. No stridor. No wheezing, rhonchi or rales.  Abdominal:     General: Bowel sounds are normal. There is no distension.     Palpations: Abdomen is soft. There is no mass.     Tenderness: There is no abdominal tenderness. There is no guarding.     Hernia: No hernia is present.  Musculoskeletal:     Cervical back: Normal range of motion and neck supple.     Right lower leg: No edema.     Left lower leg: No edema.  Lymphadenopathy:     Cervical: No cervical adenopathy.   Skin:    General: Skin is warm and dry.  Neurological:     Mental Status: She is alert and oriented to person, place, and time. Mental status is at baseline.  Psychiatric:        Mood and Affect: Mood normal.        Behavior: Behavior normal.     Depression Screen    10/14/2023   10:25 AM 12/15/2018    2:54 PM  PHQ 2/9 Scores  PHQ - 2 Score 0 0  PHQ- 9 Score 6    Results for orders placed or performed in visit on 10/14/23  Microalbumin / creatinine urine ratio  Result Value Ref Range   Creatinine, Urine 146.1 Not Estab. mg/dL   Microalbumin, Urine 86.8 Not Estab. ug/mL   Microalb/Creat Ratio 9 0 - 29 mg/g creat  Comprehensive metabolic panel with GFR  Result Value Ref Range   Glucose 87 70 - 99 mg/dL   BUN 14 8 - 27 mg/dL   Creatinine, Ser 9.01 0.57 - 1.00 mg/dL   eGFR 66 >40 fO/fpw/8.26   BUN/Creatinine Ratio 14 12 - 28   Sodium 141 134 - 144 mmol/L   Potassium 3.9 3.5 - 5.2 mmol/L   Chloride 100 96 - 106 mmol/L   CO2 21 20 - 29 mmol/L   Calcium 10.0 8.7 - 10.3 mg/dL   Total Protein 7.8 6.0 - 8.5 g/dL   Albumin 4.8 3.9 - 4.9 g/dL   Globulin, Total 3.0 1.5 - 4.5 g/dL   Bilirubin Total 0.5 0.0 - 1.2 mg/dL   Alkaline Phosphatase 89 49 - 135 IU/L   AST 17 0 -  40 IU/L   ALT 10 0 - 32 IU/L  Lipid panel  Result Value Ref Range   Cholesterol, Total 199 100 - 199 mg/dL   Triglycerides 80 0 - 149 mg/dL   HDL 69 >60 mg/dL   VLDL Cholesterol Cal 14 5 - 40 mg/dL   LDL Chol Calc (NIH) 883 (H) 0 - 99 mg/dL   Chol/HDL Ratio 2.9 0.0 - 4.4 ratio  TSH Rfx on Abnormal to Free T4  Result Value Ref Range   TSH 0.570 0.450 - 4.500 uIU/mL  HIV Antibody (routine testing w rflx)  Result Value Ref Range   HIV Screen 4th Generation wRfx Non Reactive Non Reactive  HCV Ab w Reflex to Quant PCR  Result Value Ref Range   HCV Ab Non Reactive Non Reactive  Interpretation:  Result Value Ref Range   HCV Interp 1: Comment     Assessment & Plan     Encounter for medical examination to  establish care  Vaginal atrophy Assessment & Plan: Has tried premarin  cream and hated it due to it being messy.   Thyroid  nodule greater than or equal to 1.5 cm in diameter incidentally noted on imaging study -     TSH Rfx on Abnormal to Free T4 -     US  THYROID ; Future  Sleep disturbance -     traZODone  HCl; Take 0.5-1 tablets (25-50 mg total) by mouth at bedtime as needed for sleep.  Dispense: 30 tablet; Refill: 3  Chronic kidney disease, stage 2, mildly decreased GFR -     Microalbumin / creatinine urine ratio  Encounter for screening for cardiovascular disorders  Screening for endocrine, metabolic and immunity disorder -     Comprehensive metabolic panel with GFR -     Lipid panel  Pneumococcal vaccination administered at current visit  Encounter for screening for HIV -     HIV Antibody (routine testing w rflx)  Encounter for hepatitis C screening test for low risk patient -     HCV Ab w Reflex to Quant PCR -     Interpretation:  Encounter for screening mammogram for breast cancer -     3D Screening Mammogram w/Implants, Left and Right; Future  Need for shingles vaccine -     Varicella-zoster vaccine IM  Need for Tdap vaccination -     Tdap vaccine greater than or equal to 7yo IM      Encounter for medical examination to establish care Physical exam overall unremarkable except as noted above. Routine lab work ordered as noted.  Routine health maintenance and screenings discussed.  Mammogram ordered.  Patient will receive shingles vaccine today.  Defer pneumococcal vaccination due to it being out of stock today.  Vaginal atrophy Postmenopausal with mild hot flashes and dyspareunia. Estradiol /norethindrone  exacerbated headaches and did not alleviate dyspareunia. Prefers to avoid medication. Premarin  cream intolerable. - Discontinue estradiol /norethindrone  and monitor symptoms. - Consider restarting estradiol /norethindrone  if symptoms worsen. - Recommend Replens  moisturizer and increased use of lubricants for dyspareunia.  Thyroid  nodule greater than or equal to 1.5 cm in diameter incidentally noted on imaging study Thyroid  ultrasound on 09/11/2019 showed bilateral TR 3 nodules which warranted 1 year follow-up.  However, it does not appear patient ever had follow-up.  Will order thyroid  ultrasound to reevaluate these nodules.  Sleep disturbance Chronic insomnia with difficulty maintaining sleep. Prefers minimal medication use. - Consider trazodone  on an as-needed basis for sleep maintenance after several nights of poor sleep.  Chronic kidney disease  stage 2, mildly decreased GFR Chronic kidney disease stage 2 with no immediate management changes required.  Continue to optimize risk factors to reduce likelihood of progression. - Order urine test to monitor proteinuria.    Return in about 1 year (around 10/13/2024) for CPE.     I discussed the assessment and treatment plan with the patient  The patient was provided an opportunity to ask questions and all were answered. The patient agreed with the plan and demonstrated an understanding of the instructions.   The patient was advised to call back or seek an in-person evaluation if the symptoms worsen or if the condition fails to improve as anticipated.    LAURAINE LOISE BUOY, DO  [facility] [phone] (phone) [phone] (fax)  [facility]

## 2023-10-14 NOTE — Assessment & Plan Note (Signed)
 Has tried premarin  cream and hated it due to it being messy.

## 2023-10-15 ENCOUNTER — Encounter: Payer: Self-pay | Admitting: Family Medicine

## 2023-10-15 LAB — HIV ANTIBODY (ROUTINE TESTING W REFLEX): HIV Screen 4th Generation wRfx: NONREACTIVE

## 2023-10-15 LAB — TSH RFX ON ABNORMAL TO FREE T4: TSH: 0.57 u[IU]/mL (ref 0.450–4.500)

## 2023-10-15 LAB — COMPREHENSIVE METABOLIC PANEL WITH GFR
ALT: 10 IU/L (ref 0–32)
AST: 17 IU/L (ref 0–40)
Albumin: 4.8 g/dL (ref 3.9–4.9)
Alkaline Phosphatase: 89 IU/L (ref 49–135)
BUN/Creatinine Ratio: 14 (ref 12–28)
BUN: 14 mg/dL (ref 8–27)
Bilirubin Total: 0.5 mg/dL (ref 0.0–1.2)
CO2: 21 mmol/L (ref 20–29)
Calcium: 10 mg/dL (ref 8.7–10.3)
Chloride: 100 mmol/L (ref 96–106)
Creatinine, Ser: 0.98 mg/dL (ref 0.57–1.00)
Globulin, Total: 3 g/dL (ref 1.5–4.5)
Glucose: 87 mg/dL (ref 70–99)
Potassium: 3.9 mmol/L (ref 3.5–5.2)
Sodium: 141 mmol/L (ref 134–144)
Total Protein: 7.8 g/dL (ref 6.0–8.5)
eGFR: 66 mL/min/1.73 (ref >59)

## 2023-10-15 LAB — HCV AB W REFLEX TO QUANT PCR: HCV Ab: NONREACTIVE

## 2023-10-15 LAB — LIPID PANEL
Chol/HDL Ratio: 2.9 ratio (ref 0.0–4.4)
Cholesterol, Total: 199 mg/dL (ref 100–199)
HDL: 69 mg/dL (ref >39)
LDL Chol Calc (NIH): 116 mg/dL — ABNORMAL HIGH (ref 0–99)
Triglycerides: 80 mg/dL (ref 0–149)
VLDL Cholesterol Cal: 14 mg/dL (ref 5–40)

## 2023-10-15 LAB — MICROALBUMIN / CREATININE URINE RATIO
Creatinine, Urine: 146.1 mg/dL
Microalb/Creat Ratio: 9 mg/g{creat} (ref 0–29)
Microalbumin, Urine: 13.1 ug/mL

## 2023-10-15 LAB — HCV INTERPRETATION

## 2023-10-20 ENCOUNTER — Encounter: Payer: Self-pay | Admitting: Family Medicine

## 2023-10-21 ENCOUNTER — Ambulatory Visit: Payer: Self-pay | Admitting: Family Medicine

## 2023-10-28 ENCOUNTER — Ambulatory Visit

## 2023-11-05 ENCOUNTER — Ambulatory Visit
Admission: RE | Admit: 2023-11-05 | Discharge: 2023-11-05 | Disposition: A | Discharge status: Home / Self Care | Source: Ambulatory Visit | Attending: Family Medicine | Admitting: Family Medicine

## 2023-11-05 DIAGNOSIS — Z1231 Encounter for screening mammogram for malignant neoplasm of breast: Secondary | ICD-10-CM | POA: Diagnosis not present

## 2023-12-02 DIAGNOSIS — L820 Inflamed seborrheic keratosis: Secondary | ICD-10-CM | POA: Diagnosis not present

## 2023-12-02 DIAGNOSIS — D2262 Melanocytic nevi of left upper limb, including shoulder: Secondary | ICD-10-CM | POA: Diagnosis not present

## 2023-12-02 DIAGNOSIS — R208 Other disturbances of skin sensation: Secondary | ICD-10-CM | POA: Diagnosis not present

## 2023-12-02 DIAGNOSIS — D2261 Melanocytic nevi of right upper limb, including shoulder: Secondary | ICD-10-CM | POA: Diagnosis not present

## 2023-12-02 DIAGNOSIS — L570 Actinic keratosis: Secondary | ICD-10-CM | POA: Diagnosis not present

## 2023-12-02 DIAGNOSIS — L538 Other specified erythematous conditions: Secondary | ICD-10-CM | POA: Diagnosis not present

## 2023-12-02 DIAGNOSIS — D225 Melanocytic nevi of trunk: Secondary | ICD-10-CM | POA: Diagnosis not present

## 2023-12-02 DIAGNOSIS — D2272 Melanocytic nevi of left lower limb, including hip: Secondary | ICD-10-CM | POA: Diagnosis not present

## 2023-12-31 DIAGNOSIS — M9902 Segmental and somatic dysfunction of thoracic region: Secondary | ICD-10-CM | POA: Diagnosis not present

## 2023-12-31 DIAGNOSIS — M542 Cervicalgia: Secondary | ICD-10-CM | POA: Diagnosis not present

## 2023-12-31 DIAGNOSIS — M9901 Segmental and somatic dysfunction of cervical region: Secondary | ICD-10-CM | POA: Diagnosis not present

## 2023-12-31 DIAGNOSIS — M9903 Segmental and somatic dysfunction of lumbar region: Secondary | ICD-10-CM | POA: Diagnosis not present

## 2024-01-01 DIAGNOSIS — M542 Cervicalgia: Secondary | ICD-10-CM | POA: Diagnosis not present

## 2024-01-01 DIAGNOSIS — M9901 Segmental and somatic dysfunction of cervical region: Secondary | ICD-10-CM | POA: Diagnosis not present

## 2024-01-01 DIAGNOSIS — M9902 Segmental and somatic dysfunction of thoracic region: Secondary | ICD-10-CM | POA: Diagnosis not present

## 2024-01-01 DIAGNOSIS — M9903 Segmental and somatic dysfunction of lumbar region: Secondary | ICD-10-CM | POA: Diagnosis not present

## 2024-01-03 DIAGNOSIS — M9901 Segmental and somatic dysfunction of cervical region: Secondary | ICD-10-CM | POA: Diagnosis not present

## 2024-01-03 DIAGNOSIS — M9902 Segmental and somatic dysfunction of thoracic region: Secondary | ICD-10-CM | POA: Diagnosis not present

## 2024-01-03 DIAGNOSIS — M542 Cervicalgia: Secondary | ICD-10-CM | POA: Diagnosis not present

## 2024-01-03 DIAGNOSIS — M9903 Segmental and somatic dysfunction of lumbar region: Secondary | ICD-10-CM | POA: Diagnosis not present

## 2024-01-06 DIAGNOSIS — M9902 Segmental and somatic dysfunction of thoracic region: Secondary | ICD-10-CM | POA: Diagnosis not present

## 2024-01-06 DIAGNOSIS — M542 Cervicalgia: Secondary | ICD-10-CM | POA: Diagnosis not present

## 2024-01-06 DIAGNOSIS — M9901 Segmental and somatic dysfunction of cervical region: Secondary | ICD-10-CM | POA: Diagnosis not present

## 2024-01-06 DIAGNOSIS — M9903 Segmental and somatic dysfunction of lumbar region: Secondary | ICD-10-CM | POA: Diagnosis not present

## 2024-01-07 DIAGNOSIS — M9901 Segmental and somatic dysfunction of cervical region: Secondary | ICD-10-CM | POA: Diagnosis not present

## 2024-01-07 DIAGNOSIS — M542 Cervicalgia: Secondary | ICD-10-CM | POA: Diagnosis not present

## 2024-01-07 DIAGNOSIS — M9902 Segmental and somatic dysfunction of thoracic region: Secondary | ICD-10-CM | POA: Diagnosis not present

## 2024-01-07 DIAGNOSIS — M9903 Segmental and somatic dysfunction of lumbar region: Secondary | ICD-10-CM | POA: Diagnosis not present

## 2024-01-08 DIAGNOSIS — M9901 Segmental and somatic dysfunction of cervical region: Secondary | ICD-10-CM | POA: Diagnosis not present

## 2024-01-08 DIAGNOSIS — M9902 Segmental and somatic dysfunction of thoracic region: Secondary | ICD-10-CM | POA: Diagnosis not present

## 2024-01-08 DIAGNOSIS — M9903 Segmental and somatic dysfunction of lumbar region: Secondary | ICD-10-CM | POA: Diagnosis not present

## 2024-01-08 DIAGNOSIS — M542 Cervicalgia: Secondary | ICD-10-CM | POA: Diagnosis not present

## 2024-01-13 DIAGNOSIS — M542 Cervicalgia: Secondary | ICD-10-CM | POA: Diagnosis not present

## 2024-01-13 DIAGNOSIS — M9902 Segmental and somatic dysfunction of thoracic region: Secondary | ICD-10-CM | POA: Diagnosis not present

## 2024-01-13 DIAGNOSIS — M9903 Segmental and somatic dysfunction of lumbar region: Secondary | ICD-10-CM | POA: Diagnosis not present

## 2024-01-13 DIAGNOSIS — M9901 Segmental and somatic dysfunction of cervical region: Secondary | ICD-10-CM | POA: Diagnosis not present

## 2024-01-15 DIAGNOSIS — M9901 Segmental and somatic dysfunction of cervical region: Secondary | ICD-10-CM | POA: Diagnosis not present

## 2024-01-15 DIAGNOSIS — M542 Cervicalgia: Secondary | ICD-10-CM | POA: Diagnosis not present

## 2024-01-15 DIAGNOSIS — M9902 Segmental and somatic dysfunction of thoracic region: Secondary | ICD-10-CM | POA: Diagnosis not present

## 2024-01-15 DIAGNOSIS — M9903 Segmental and somatic dysfunction of lumbar region: Secondary | ICD-10-CM | POA: Diagnosis not present

## 2024-10-15 ENCOUNTER — Encounter: Admitting: Family Medicine
